# Patient Record
Sex: Female | Born: 1960
Health system: Southern US, Community
[De-identification: ages and names within clinical notes are randomized; demographics above are authoritative.]

## PROBLEM LIST (undated history)

## (undated) DIAGNOSIS — G43829 Menstrual migraine, not intractable, without status migrainosus: Secondary | ICD-10-CM

## (undated) DIAGNOSIS — N390 Urinary tract infection, site not specified: Secondary | ICD-10-CM

## (undated) DIAGNOSIS — Z6741 Type O blood, Rh negative: Secondary | ICD-10-CM

## (undated) DIAGNOSIS — N979 Female infertility, unspecified: Secondary | ICD-10-CM

## (undated) DIAGNOSIS — I10 Essential (primary) hypertension: Secondary | ICD-10-CM

## (undated) DIAGNOSIS — R87619 Unspecified abnormal cytological findings in specimens from cervix uteri: Secondary | ICD-10-CM

## (undated) DIAGNOSIS — E079 Disorder of thyroid, unspecified: Secondary | ICD-10-CM

## (undated) DIAGNOSIS — N841 Polyp of cervix uteri: Secondary | ICD-10-CM

## (undated) DIAGNOSIS — Z87440 Personal history of urinary (tract) infections: Secondary | ICD-10-CM

## (undated) HISTORY — DX: Urinary tract infection, site not specified: N39.0

## (undated) HISTORY — DX: Type O blood, Rh negative: Z67.41

## (undated) HISTORY — DX: Menstrual migraine, not intractable, without status migrainosus: G43.829

## (undated) HISTORY — PX: WISDOM TOOTH EXTRACTION: SHX21

## (undated) HISTORY — DX: Essential (primary) hypertension: I10

## (undated) HISTORY — DX: Unspecified abnormal cytological findings in specimens from cervix uteri: R87.619

## (undated) HISTORY — DX: Personal history of urinary (tract) infections: Z87.440

## (undated) HISTORY — DX: Polyp of cervix uteri: N84.1

## (undated) HISTORY — DX: Disorder of thyroid, unspecified: E07.9

## (undated) HISTORY — PX: COLPOSCOPY: SHX161

## (undated) HISTORY — DX: Female infertility, unspecified: N97.9

---

## 1998-10-25 ENCOUNTER — Other Ambulatory Visit: Admission: RE | Admit: 1998-10-25 | Discharge: 1998-10-25 | Payer: Self-pay | Admitting: Obstetrics and Gynecology

## 2000-11-12 ENCOUNTER — Other Ambulatory Visit: Admission: RE | Admit: 2000-11-12 | Discharge: 2000-11-12 | Payer: Self-pay | Admitting: Obstetrics and Gynecology

## 2001-03-07 ENCOUNTER — Ambulatory Visit (HOSPITAL_COMMUNITY): Admission: RE | Admit: 2001-03-07 | Discharge: 2001-03-07 | Payer: Self-pay | Admitting: Obstetrics and Gynecology

## 2001-03-07 ENCOUNTER — Encounter: Payer: Self-pay | Admitting: Obstetrics and Gynecology

## 2001-03-11 ENCOUNTER — Encounter: Admission: RE | Admit: 2001-03-11 | Discharge: 2001-03-11 | Payer: Self-pay

## 2001-03-11 ENCOUNTER — Encounter: Payer: Self-pay | Admitting: Obstetrics and Gynecology

## 2002-03-03 ENCOUNTER — Other Ambulatory Visit: Admission: RE | Admit: 2002-03-03 | Discharge: 2002-03-03 | Payer: Self-pay | Admitting: Obstetrics and Gynecology

## 2002-08-08 ENCOUNTER — Ambulatory Visit (HOSPITAL_COMMUNITY): Admission: RE | Admit: 2002-08-08 | Discharge: 2002-08-08 | Payer: Self-pay | Admitting: Obstetrics and Gynecology

## 2002-08-08 ENCOUNTER — Encounter: Payer: Self-pay | Admitting: Obstetrics and Gynecology

## 2003-03-05 ENCOUNTER — Other Ambulatory Visit: Admission: RE | Admit: 2003-03-05 | Discharge: 2003-03-05 | Payer: Self-pay | Admitting: Obstetrics and Gynecology

## 2003-08-28 ENCOUNTER — Ambulatory Visit (HOSPITAL_COMMUNITY): Admission: RE | Admit: 2003-08-28 | Discharge: 2003-08-28 | Payer: Self-pay | Admitting: Obstetrics and Gynecology

## 2004-03-27 ENCOUNTER — Other Ambulatory Visit: Admission: RE | Admit: 2004-03-27 | Discharge: 2004-03-27 | Payer: Self-pay | Admitting: Obstetrics and Gynecology

## 2004-09-16 ENCOUNTER — Ambulatory Visit (HOSPITAL_COMMUNITY): Admission: RE | Admit: 2004-09-16 | Discharge: 2004-09-16 | Payer: Self-pay | Admitting: Obstetrics and Gynecology

## 2004-10-01 ENCOUNTER — Encounter: Admission: RE | Admit: 2004-10-01 | Discharge: 2004-10-01 | Payer: Self-pay | Admitting: Obstetrics and Gynecology

## 2005-03-30 ENCOUNTER — Other Ambulatory Visit: Admission: RE | Admit: 2005-03-30 | Discharge: 2005-03-30 | Payer: Self-pay | Admitting: Obstetrics and Gynecology

## 2005-04-01 ENCOUNTER — Encounter: Admission: RE | Admit: 2005-04-01 | Discharge: 2005-04-01 | Payer: Self-pay | Admitting: Obstetrics and Gynecology

## 2005-10-06 ENCOUNTER — Ambulatory Visit (HOSPITAL_COMMUNITY): Admission: RE | Admit: 2005-10-06 | Discharge: 2005-10-06 | Payer: Self-pay | Admitting: Obstetrics and Gynecology

## 2006-03-03 ENCOUNTER — Encounter: Admission: RE | Admit: 2006-03-03 | Discharge: 2006-03-03 | Payer: Self-pay | Admitting: Family Medicine

## 2006-03-15 HISTORY — PX: CHOLECYSTECTOMY, LAPAROSCOPIC: SHX56

## 2006-04-09 ENCOUNTER — Ambulatory Visit (HOSPITAL_COMMUNITY): Admission: RE | Admit: 2006-04-09 | Discharge: 2006-04-09 | Payer: Self-pay | Admitting: Surgery

## 2006-04-09 ENCOUNTER — Encounter (INDEPENDENT_AMBULATORY_CARE_PROVIDER_SITE_OTHER): Payer: Self-pay | Admitting: *Deleted

## 2006-05-27 ENCOUNTER — Other Ambulatory Visit: Admission: RE | Admit: 2006-05-27 | Discharge: 2006-05-27 | Payer: Self-pay | Admitting: Obstetrics and Gynecology

## 2006-06-15 DIAGNOSIS — E079 Disorder of thyroid, unspecified: Secondary | ICD-10-CM

## 2006-06-15 HISTORY — DX: Disorder of thyroid, unspecified: E07.9

## 2006-10-12 ENCOUNTER — Ambulatory Visit (HOSPITAL_COMMUNITY): Admission: RE | Admit: 2006-10-12 | Discharge: 2006-10-12 | Payer: Self-pay | Admitting: Obstetrics and Gynecology

## 2007-05-24 ENCOUNTER — Other Ambulatory Visit: Admission: RE | Admit: 2007-05-24 | Discharge: 2007-05-24 | Payer: Self-pay | Admitting: Obstetrics and Gynecology

## 2007-10-21 ENCOUNTER — Ambulatory Visit (HOSPITAL_COMMUNITY): Admission: RE | Admit: 2007-10-21 | Discharge: 2007-10-21 | Payer: Self-pay | Admitting: Obstetrics and Gynecology

## 2008-06-21 ENCOUNTER — Encounter: Admission: RE | Admit: 2008-06-21 | Discharge: 2008-06-21 | Payer: Self-pay | Admitting: Obstetrics and Gynecology

## 2008-06-21 ENCOUNTER — Other Ambulatory Visit: Admission: RE | Admit: 2008-06-21 | Discharge: 2008-06-21 | Payer: Self-pay | Admitting: Obstetrics and Gynecology

## 2008-11-01 ENCOUNTER — Ambulatory Visit (HOSPITAL_COMMUNITY): Admission: RE | Admit: 2008-11-01 | Discharge: 2008-11-01 | Payer: Self-pay | Admitting: Obstetrics and Gynecology

## 2008-11-09 ENCOUNTER — Encounter: Admission: RE | Admit: 2008-11-09 | Discharge: 2008-11-09 | Payer: Self-pay | Admitting: Endocrinology

## 2008-12-11 ENCOUNTER — Other Ambulatory Visit: Admission: RE | Admit: 2008-12-11 | Discharge: 2008-12-11 | Payer: Self-pay | Admitting: Interventional Radiology

## 2008-12-11 ENCOUNTER — Encounter (INDEPENDENT_AMBULATORY_CARE_PROVIDER_SITE_OTHER): Payer: Self-pay | Admitting: Interventional Radiology

## 2008-12-11 ENCOUNTER — Encounter: Admission: RE | Admit: 2008-12-11 | Discharge: 2008-12-11 | Payer: Self-pay | Admitting: Endocrinology

## 2009-06-15 DIAGNOSIS — I10 Essential (primary) hypertension: Secondary | ICD-10-CM

## 2009-06-15 HISTORY — DX: Essential (primary) hypertension: I10

## 2009-11-12 ENCOUNTER — Ambulatory Visit (HOSPITAL_COMMUNITY): Admission: RE | Admit: 2009-11-12 | Discharge: 2009-11-12 | Payer: Self-pay | Admitting: Obstetrics and Gynecology

## 2009-11-20 ENCOUNTER — Encounter: Admission: RE | Admit: 2009-11-20 | Discharge: 2009-11-20 | Payer: Self-pay | Admitting: Endocrinology

## 2010-07-06 ENCOUNTER — Encounter: Payer: Self-pay | Admitting: Obstetrics and Gynecology

## 2010-07-06 ENCOUNTER — Encounter: Payer: Self-pay | Admitting: Family Medicine

## 2010-09-24 ENCOUNTER — Other Ambulatory Visit: Payer: Self-pay | Admitting: Obstetrics and Gynecology

## 2010-09-24 DIAGNOSIS — Z1231 Encounter for screening mammogram for malignant neoplasm of breast: Secondary | ICD-10-CM

## 2010-10-31 NOTE — Op Note (Signed)
NAMECRYSTALLEE, WERDEN               ACCOUNT NO.:  [id number]   MEDICAL RECORD NO.:  [id number]          PATIENT TYPE:  AMB   LOCATION:  DAY                          FACILITY:  [facility]   PHYSICIAN:  Wilmon Arms. Corliss Skains, M.D. DATE OF BIRTH:  [birth year]   DATE OF PROCEDURE:  04/09/2006  DATE OF DISCHARGE:  04/09/2006                                 OPERATIVE REPORT   PREOPERATIVE DIAGNOSIS:  Chronic calculus cholecystitis.   POSTOPERATIVE DIAGNOSIS:  Chronic calculus cholecystitis.   PROCEDURE PERFORMED:  Laparoscopic cholecystectomy with intraoperative  cholangiogram.   SURGEON:  Wilmon Arms. Tsuei, M.D.   ANESTHESIA:  General endotracheal.   INDICATIONS:  The patient is a 50 year old female who recently presented  with several months of intermittent right upper quadrant pain radiating  through to her back.  This usually happens with certain types of food.  The  episodes became more frequent.  An ultrasound showed multiple gallstones.  The common duct was normal.  She was evaluated and we recommended  laparoscopic cholecystectomy.   DESCRIPTION OF PROCEDURE:  The patient brought to the operating room and  placed in the supine position on the operating table.  After an adequate  level of general anesthesia was obtained, the patient's abdomen was prepped  with Betadine and draped in sterile fashion.  Her umbilicus was infiltrated  with 0.25% Marcaine.  A transverse incision was made below the umbilicus.  Dissection was carried down to the fascia which was opened vertically.  The  peritoneal cavity was bluntly entered.  A stay suture of 0 Vicryl was placed  around the fascial opening.  The Hasson cannula was inserted and secured  with the stay suture.  Pneumoperitoneum was obtained by insufflating CO2,  maintaining a maximal pressure of 50 mmHg.  The patient was positioned in  reverse Trendelenburg position, tilted slightly to her left.  A 10 mm port  was placed in the subxiphoid position,  two 5 mm ports in the right upper  quadrant.  The fundus of the gallbladder was grasped and clamped, and  elevated over the edge of the liver.  The peritoneum around the hilum of the  gallbladder was opened with cautery.  We were able to identify the cystic  duct.  We circumferentially dissected around the duct.  The duct was ligated  distally with a clip.  The small opening was created on the cystic duct with  the scissors.  A Cook cholangiogram catheter was then inserted through a  stab incision and threaded into the cystic duct.  It was held with a clip.  A cholangiogram was then obtained which showed good flow proximally and  distally and a small caliber common bile duct.  There was good flow into  both sides of the liver as well as into the duodenum.  No filling defects  were noted.  The catheter was then removed.  The duct was ligated clips  distally and divided.  The cystic artery was also ligated with clips and  divided.  Cautery was then used to remove the gallbladder from the liver  bed.  Hemostasis was  obtained with cautery.  The gallbladder was placed in  an EndoCatch sac and removed through the umbilical fascia.  The pursestring  suture was used to close down the umbilical fascia.  We reinspected the  right upper quadrant.  No bleeding was noted.  The irrigant was  suctioned out.  Pneumoperitoneum was then released as the ports were  removed.  Monocryl 4-0 was used to close all skin incisions.  Steri-Strips  and clean dressings were applied.  The patient was then extubated and  brought to recovery in stable condition.  All sponge, instrument, and needle  counts were correct.      Wilmon Arms. Tsuei, M.D.  Electronically Signed     MKT/MEDQ  D:  04/09/2006  T:  04/11/2006  Job:  [id number]   cc:   Quita Skye. Artis Flock, M.D.  Fax: [phone]

## 2010-11-14 ENCOUNTER — Ambulatory Visit (HOSPITAL_COMMUNITY)
Admission: RE | Admit: 2010-11-14 | Discharge: 2010-11-14 | Disposition: A | Discharge status: Home / Self Care | Payer: Managed Care, Other (non HMO) | Source: Ambulatory Visit | Attending: Obstetrics and Gynecology | Admitting: Obstetrics and Gynecology

## 2010-11-14 DIAGNOSIS — Z1231 Encounter for screening mammogram for malignant neoplasm of breast: Secondary | ICD-10-CM | POA: Insufficient documentation

## 2010-11-20 ENCOUNTER — Other Ambulatory Visit: Payer: Self-pay | Admitting: Obstetrics and Gynecology

## 2010-11-20 DIAGNOSIS — R928 Other abnormal and inconclusive findings on diagnostic imaging of breast: Secondary | ICD-10-CM

## 2010-11-24 ENCOUNTER — Ambulatory Visit
Admission: RE | Admit: 2010-11-24 | Discharge: 2010-11-24 | Disposition: A | Discharge status: Home / Self Care | Payer: Managed Care, Other (non HMO) | Source: Ambulatory Visit | Attending: Obstetrics and Gynecology | Admitting: Obstetrics and Gynecology

## 2010-11-24 DIAGNOSIS — R928 Other abnormal and inconclusive findings on diagnostic imaging of breast: Secondary | ICD-10-CM

## 2011-04-24 ENCOUNTER — Other Ambulatory Visit: Payer: Self-pay | Admitting: Obstetrics and Gynecology

## 2011-04-24 DIAGNOSIS — N6459 Other signs and symptoms in breast: Secondary | ICD-10-CM

## 2011-05-20 ENCOUNTER — Ambulatory Visit
Admission: RE | Admit: 2011-05-20 | Discharge: 2011-05-20 | Disposition: A | Discharge status: Home / Self Care | Payer: Managed Care, Other (non HMO) | Source: Ambulatory Visit | Attending: Obstetrics and Gynecology | Admitting: Obstetrics and Gynecology

## 2011-05-20 DIAGNOSIS — N6459 Other signs and symptoms in breast: Secondary | ICD-10-CM

## 2011-06-22 ENCOUNTER — Other Ambulatory Visit: Payer: Self-pay | Admitting: Endocrinology

## 2011-06-22 DIAGNOSIS — E049 Nontoxic goiter, unspecified: Secondary | ICD-10-CM

## 2011-08-13 LAB — HM PAP SMEAR: HM Pap smear: NEGATIVE

## 2011-08-17 ENCOUNTER — Ambulatory Visit
Admission: RE | Admit: 2011-08-17 | Discharge: 2011-08-17 | Disposition: A | Discharge status: Home / Self Care | Payer: Managed Care, Other (non HMO) | Source: Ambulatory Visit | Attending: Endocrinology | Admitting: Endocrinology

## 2011-08-17 DIAGNOSIS — E049 Nontoxic goiter, unspecified: Secondary | ICD-10-CM

## 2011-10-14 ENCOUNTER — Other Ambulatory Visit: Payer: Self-pay | Admitting: Obstetrics and Gynecology

## 2011-10-14 DIAGNOSIS — N6489 Other specified disorders of breast: Secondary | ICD-10-CM

## 2011-11-20 ENCOUNTER — Other Ambulatory Visit: Payer: Managed Care, Other (non HMO)

## 2011-12-03 ENCOUNTER — Ambulatory Visit
Admission: RE | Admit: 2011-12-03 | Discharge: 2011-12-03 | Disposition: A | Discharge status: Home / Self Care | Payer: Managed Care, Other (non HMO) | Source: Ambulatory Visit | Attending: Obstetrics and Gynecology | Admitting: Obstetrics and Gynecology

## 2011-12-03 ENCOUNTER — Other Ambulatory Visit: Payer: Self-pay | Admitting: Obstetrics and Gynecology

## 2011-12-03 DIAGNOSIS — N6489 Other specified disorders of breast: Secondary | ICD-10-CM

## 2012-06-20 ENCOUNTER — Other Ambulatory Visit: Payer: Self-pay | Admitting: Endocrinology

## 2012-06-20 DIAGNOSIS — E049 Nontoxic goiter, unspecified: Secondary | ICD-10-CM

## 2012-06-28 ENCOUNTER — Ambulatory Visit
Admission: RE | Admit: 2012-06-28 | Discharge: 2012-06-28 | Disposition: A | Discharge status: Home / Self Care | Payer: BC Managed Care – PPO | Source: Ambulatory Visit | Attending: Endocrinology | Admitting: Endocrinology

## 2012-06-28 DIAGNOSIS — E049 Nontoxic goiter, unspecified: Secondary | ICD-10-CM

## 2012-10-25 ENCOUNTER — Other Ambulatory Visit: Payer: Self-pay | Admitting: Obstetrics and Gynecology

## 2012-10-25 DIAGNOSIS — Z1231 Encounter for screening mammogram for malignant neoplasm of breast: Secondary | ICD-10-CM

## 2012-12-06 ENCOUNTER — Ambulatory Visit (HOSPITAL_COMMUNITY)
Admission: RE | Admit: 2012-12-06 | Discharge: 2012-12-06 | Disposition: A | Discharge status: Home / Self Care | Payer: BC Managed Care – PPO | Source: Ambulatory Visit | Attending: Obstetrics and Gynecology | Admitting: Obstetrics and Gynecology

## 2012-12-06 DIAGNOSIS — Z1231 Encounter for screening mammogram for malignant neoplasm of breast: Secondary | ICD-10-CM | POA: Insufficient documentation

## 2012-12-19 ENCOUNTER — Other Ambulatory Visit: Payer: Self-pay | Admitting: Endocrinology

## 2012-12-19 DIAGNOSIS — E049 Nontoxic goiter, unspecified: Secondary | ICD-10-CM

## 2013-04-10 ENCOUNTER — Encounter: Payer: Self-pay | Admitting: Obstetrics & Gynecology

## 2013-04-10 ENCOUNTER — Encounter: Payer: Self-pay | Admitting: Nurse Practitioner

## 2013-04-10 ENCOUNTER — Ambulatory Visit (INDEPENDENT_AMBULATORY_CARE_PROVIDER_SITE_OTHER): Payer: BC Managed Care – PPO | Admitting: Nurse Practitioner

## 2013-04-10 VITALS — BP 118/72 | HR 68 | Temp 97.8°F | Resp 16 | Ht 64.25 in | Wt 195.0 lb

## 2013-04-10 DIAGNOSIS — N390 Urinary tract infection, site not specified: Secondary | ICD-10-CM

## 2013-04-10 LAB — POCT URINALYSIS DIPSTICK
Bilirubin, UA: NEGATIVE
Glucose, UA: NEGATIVE
Ketones, UA: NEGATIVE
Nitrite, UA: POSITIVE
Protein, UA: NEGATIVE
Urobilinogen, UA: NEGATIVE
pH, UA: 5

## 2013-04-10 MED ORDER — CIPROFLOXACIN HCL 500 MG PO TABS: 500.0000 mg | ORAL_TABLET | Freq: Two times a day (BID) | ORAL | Status: DC

## 2013-04-10 NOTE — Patient Instructions (Signed)
We will call with results and follow if TOC is indicated.

## 2013-04-10 NOTE — Progress Notes (Signed)
S:  52 y.o. G0 Married Caucasian female presents with UTI symptoms that started on  October 24, with symptoms of dysuria, urinary urgency.  Pertinent negatives include no N/ V, flank pain. The patient is having no constitutional symptoms, denying fever, chills, anorexia, or weight loss.  Symptoms are related to post coital and normally takes no antibiotics PC.  She also denies vaginal dryness.   Current method of birth control OCP. Same partner without change. Last UTI documented in  2010. Denies any vaginal symptoms or discharge.  ROS: no weight loss, fever, night sweats and feels well  O alert, oriented to person, place, and time, normal mood, behavior,  speech, dress, motor activity, and thought processes   obese,  not in acute distress, well developed and well nourished  Abdomen mild suprapubic discomfort, without guarding  No CVA tenderness    Diagnostic Test:    Urine culture   urine chemstrip  Poct urine- wbc 1+, nitrite positive, rbc 2+  Assessment:  R/O UTI   History of post coital UTI - but years since last infection     Plan:  Medications: ciprofloxacin. Maintain adequate hydration. Follow up if symptoms not improving, and as needed.   Medication Therapy: 500 mg two times daily   Lab:TOC if Urine Culture is positive

## 2013-04-12 LAB — URINE CULTURE: Colony Count: 50000

## 2013-04-12 NOTE — Progress Notes (Signed)
Encounter reviewed by Dr. Brook Silva.  

## 2013-04-13 ENCOUNTER — Other Ambulatory Visit: Payer: Self-pay | Admitting: Nurse Practitioner

## 2013-04-13 DIAGNOSIS — N390 Urinary tract infection, site not specified: Secondary | ICD-10-CM

## 2013-04-17 ENCOUNTER — Telehealth: Payer: Self-pay | Admitting: *Deleted

## 2013-04-17 NOTE — Telephone Encounter (Signed)
Message copied by PHILLIPS, STEPHANIE C on Mon Apr 17, 2013  9:54 AM ------      Message from: GRUBB, PATRICIA R      Created: Thu Apr 13, 2013  8:40 AM       Let patient know her urine C & S was post ive and she is on the correct antibiotic.  She will need TOC in 2 weeks for a nurse visit.  Order for repeat culture is placed in Epic. ------ 

## 2013-04-17 NOTE — Telephone Encounter (Signed)
I have attempted to contact this patient by phone with the following results: left message to return my call on answering machine (mobile/home). 

## 2013-04-17 NOTE — Telephone Encounter (Signed)
The patient has been notified of this information and all questions answered.

## 2013-04-17 NOTE — Telephone Encounter (Signed)
Message copied by Luisa Dago on Mon Apr 17, 2013  9:54 AM ------      Message from: Ria Comment R      Created: Thu Apr 13, 2013  8:40 AM       Let patient know her urine C & S was post ive and she is on the correct antibiotic.  She will need TOC in 2 weeks for a nurse visit.  Order for repeat culture is placed in Epic. ------

## 2013-04-17 NOTE — Telephone Encounter (Signed)
Patient is returning Stephanie's call

## 2013-04-25 ENCOUNTER — Ambulatory Visit (INDEPENDENT_AMBULATORY_CARE_PROVIDER_SITE_OTHER): Payer: BC Managed Care – PPO

## 2013-04-25 VITALS — BP 136/82 | HR 60 | Resp 16 | Ht 64.25 in | Wt 193.2 lb

## 2013-04-25 DIAGNOSIS — N390 Urinary tract infection, site not specified: Secondary | ICD-10-CM

## 2013-04-25 NOTE — Progress Notes (Signed)
Patient here for urine toc. States is feeling better. She completed her medication.

## 2013-04-26 LAB — URINE CULTURE: Organism ID, Bacteria: NO GROWTH

## 2013-06-19 ENCOUNTER — Ambulatory Visit
Admission: RE | Admit: 2013-06-19 | Discharge: 2013-06-19 | Disposition: A | Discharge status: Home / Self Care | Payer: BC Managed Care – PPO | Source: Ambulatory Visit | Attending: Endocrinology | Admitting: Endocrinology

## 2013-06-19 DIAGNOSIS — E049 Nontoxic goiter, unspecified: Secondary | ICD-10-CM

## 2013-06-27 ENCOUNTER — Other Ambulatory Visit: Payer: Self-pay | Admitting: Endocrinology

## 2013-06-27 DIAGNOSIS — E049 Nontoxic goiter, unspecified: Secondary | ICD-10-CM

## 2013-08-23 ENCOUNTER — Ambulatory Visit (INDEPENDENT_AMBULATORY_CARE_PROVIDER_SITE_OTHER): Payer: BC Managed Care – PPO | Admitting: Nurse Practitioner

## 2013-08-23 ENCOUNTER — Encounter: Payer: Self-pay | Admitting: Nurse Practitioner

## 2013-08-23 VITALS — BP 130/72 | HR 60 | Ht 64.0 in | Wt 191.0 lb

## 2013-08-23 DIAGNOSIS — I10 Essential (primary) hypertension: Secondary | ICD-10-CM | POA: Insufficient documentation

## 2013-08-23 DIAGNOSIS — Z01419 Encounter for gynecological examination (general) (routine) without abnormal findings: Secondary | ICD-10-CM

## 2013-08-23 DIAGNOSIS — R8290 Unspecified abnormal findings in urine: Secondary | ICD-10-CM

## 2013-08-23 DIAGNOSIS — IMO0002 Reserved for concepts with insufficient information to code with codable children: Secondary | ICD-10-CM | POA: Insufficient documentation

## 2013-08-23 DIAGNOSIS — Z0000 Encounter for general adult medical examination without abnormal findings: Secondary | ICD-10-CM

## 2013-08-23 DIAGNOSIS — E039 Hypothyroidism, unspecified: Secondary | ICD-10-CM | POA: Insufficient documentation

## 2013-08-23 DIAGNOSIS — N979 Female infertility, unspecified: Secondary | ICD-10-CM

## 2013-08-23 DIAGNOSIS — R82998 Other abnormal findings in urine: Secondary | ICD-10-CM

## 2013-08-23 LAB — POCT URINALYSIS DIPSTICK
Bilirubin, UA: NEGATIVE
Glucose, UA: NEGATIVE
Ketones, UA: NEGATIVE
Nitrite, UA: NEGATIVE
Urobilinogen, UA: NEGATIVE
pH, UA: 5.5

## 2013-08-23 LAB — HEMOGLOBIN, FINGERSTICK: Hemoglobin, fingerstick: 13.3 g/dL (ref 12.0–16.0)

## 2013-08-23 MED ORDER — FLUCONAZOLE 150 MG PO TABS: 150.0000 mg | ORAL_TABLET | Freq: Once | ORAL | Status: DC

## 2013-08-23 MED ORDER — SULFAMETHOXAZOLE-TMP DS 800-160 MG PO TABS: ORAL_TABLET | ORAL | Status: DC

## 2013-08-23 NOTE — Patient Instructions (Signed)

## 2013-08-23 NOTE — Progress Notes (Signed)
Patient ID: Melanie Vaughn, female   DOB: Feb 18, 1961, 53 y.o.   MRN: [id number] 52 y.o. G0P0 Married Caucasian Fe here for annual exam.  Last year 08/2012 normal cycle and ended OCP. March 2014 had an increase in vaso symptoms in April and May. None since June.  Then 6/28 with long cycles and spotting for 11 days. Then 8/10 had another cycle of flow and spotting for 9 days. Sept 18 very heavy flow X 5 then spotting x 3. 10/1 normal flow and spotting X 10 days. 10/28 flow for 5 days normal. Jan 21 spotting and normal X 5 days.  No urinary symptoms.  Some vaginal itching for several weeks after change in laundry detergents.  Most of symptoms are external.  Patient's last menstrual period was 07/05/2013.          Sexually active: yes  The current method of family planning is none.    Exercising: yes  Walking, kettle ball, hiking Smoker:  no  Health Maintenance: Pap:08/13/11, WNL neg HR HPV MMG: 12/06/12, Bi-Rads 1: negative Colonoscopy:  Never   TDaP:  02/2002 Labs: HB:  13.3 Urine:  Trace RBC, 2+ leuk's, trace protein, pH 5.5   reports that she has never smoked. She does not have any smokeless tobacco history on file. She reports that she does not drink alcohol or use illicit drugs.  Past Medical History  Diagnosis Date  . Infertility, female   . Menstrual migraine   . Recurrent UTI     Past Surgical History  Procedure Laterality Date  . Cholecystectomy, laparoscopic  10/07    Current Outpatient Prescriptions  Medication Sig Dispense Refill  . Calcium Carbonate-Vitamin D (CALCIUM-VITAMIN D) 500-200 MG-UNIT per tablet Take 1 tablet by mouth 2 (two) times daily with a meal.      . Cholecalciferol (VITAMIN D PO) Take 1,000 Int'l Units by mouth daily.       Marland Kitchen CRANBERRY PO Take by mouth daily.       Marland Kitchen FIBER PO Take by mouth daily.       Marland Kitchen levothyroxine (SYNTHROID, LEVOTHROID) 88 MCG tablet Take 88 mcg by mouth daily before breakfast.      . losartan (COZAAR) 50 MG tablet Take 1 tablet by  mouth daily.      . Multiple Vitamin (MULTIVITAMIN) tablet Take 1 tablet by mouth daily.      . Probiotic Product (PROBIOTIC PO) Take by mouth daily.        No current facility-administered medications for this visit.    Family History  Problem Relation Age of Onset  . Stroke Mother     mini stroke  . Heart disease Mother   . Diabetes Father   . Heart disease Father     CHF  . Heart disease Maternal Grandfather     ROS:  Pertinent items are noted in HPI.  Otherwise, a comprehensive ROS was negative.  Exam:   BP 130/72  Pulse 60  Ht 5\' 4"  (1.626 m)  Wt 191 lb (86.637 kg)  BMI 32.77 kg/m2  LMP 07/05/2013 Height: 5\' 4"  (162.6 cm)  Ht Readings from Last 3 Encounters:  08/23/13 5\' 4"  (1.626 m)  04/25/13 5' 4.25" (1.632 m)  04/10/13 5' 4.25" (1.632 m)    General appearance: alert, cooperative and appears stated age Head: Normocephalic, without obvious abnormality, atraumatic Neck: no adenopathy, supple, symmetrical, trachea midline and thyroid normal to inspection and palpation Lungs: clear to auscultation bilaterally Breasts: normal appearance, no masses or tenderness  Heart: regular rate and rhythm Abdomen: soft, non-tender; no masses,  no organomegaly Extremities: extremities normal, atraumatic, no cyanosis or edema Skin: Skin color, texture, turgor normal. No rashes or lesions Lymph nodes: Cervical, supraclavicular, and axillary nodes normal. No abnormal inguinal nodes palpated Neurologic: Grossly normal   Pelvic: External genitalia:  no lesions but irritation that may be yeast or allergic reaction.              Urethra:  normal appearing urethra with no masses, tenderness or lesions              Bartholin's and Skene's: normal                 Vagina: normal appearing vagina with normal color and discharge, no lesions              Cervix: anteverted              Pap taken: no Bimanual Exam:  Uterus:  normal size, contour, position, consistency, mobility, non-tender               Adnexa: no mass, fullness, tenderness               Rectovaginal: Confirms               Anus:  normal sphincter tone, no lesions  A:  Well Woman with normal exam  Perimenopausal still irregular cycles off OCP  External yeast vaginitis vs allergic reaction  PC UTI uses Septra prn  History of hypothyroid, infertility, HTN, borderline DM     P:   Pap smear as per guidelines   Mammogram due 11/2013  RX for Diflucan 150 mg X 2 if no better to call back  Refill on Septra DS to use PC prn  Dr. Chalmers Cater follows endocrine problems  Counseled on breast self exam, mammography screening, adequate intake of calcium and vitamin D, diet and exercise return annually or prn  An After Visit Summary was printed and given to the patient.

## 2013-08-25 LAB — URINE CULTURE: Colony Count: >=100000

## 2013-08-29 ENCOUNTER — Telehealth: Payer: Self-pay | Admitting: Nurse Practitioner

## 2013-08-29 NOTE — Telephone Encounter (Signed)
Patient was treated last week for a yeast infection and still has symptoms.

## 2013-08-29 NOTE — Telephone Encounter (Signed)
Spoke with patient she took [place] on 3/11 and 3/13. Patient c/o "Lots of itching that is worse at night." No vaginal discharge. Patient reports itching in vulvar and buttock area stating that it is about the same since [facility] last saw it. Used generic Monistat with no relief. Patient has d/c use of old detergent. Reports keeping the area clean and dry.   Please advise for next steps.

## 2013-08-29 NOTE — Progress Notes (Signed)
Encounter reviewed by Dr. Brook Silva.  

## 2013-08-29 NOTE — Telephone Encounter (Signed)
Call in Triamcinolone with nystatin to use BID prn and things should calm down. She may also do Aveeno sitz bath.

## 2013-08-30 MED ORDER — NYSTATIN-TRIAMCINOLONE 100000-0.1 UNIT/GM-% EX OINT: 1.0000 "application " | TOPICAL_OINTMENT | Freq: Two times a day (BID) | CUTANEOUS | Status: DC

## 2013-08-30 NOTE — Telephone Encounter (Signed)
Spoke with patient. Patient is aware of prescription being sent to pharmacy of choice. Patient states that she is still experiencing "lots of itching at night." No vaginal discharge. Patient declines appointment today stating that she will try the Triamcinolone with nystatin first to see if it provides her any relief. Patient states that if it does not get any better she will call to schedule an appointment to be seen. Advised if she experiences any symptom changes or symptoms worsen to call to get an appointment. Patient also instructed she can use Aveeno sitz bath to provide relief.  Routing to provider for final review. Patient agreeable to disposition. Will close encounter

## 2013-11-29 ENCOUNTER — Other Ambulatory Visit: Payer: Self-pay | Admitting: Nurse Practitioner

## 2013-11-29 DIAGNOSIS — Z1231 Encounter for screening mammogram for malignant neoplasm of breast: Secondary | ICD-10-CM

## 2013-12-07 ENCOUNTER — Ambulatory Visit (HOSPITAL_COMMUNITY)
Admission: RE | Admit: 2013-12-07 | Discharge: 2013-12-07 | Disposition: A | Discharge status: Home / Self Care | Payer: BC Managed Care – PPO | Source: Ambulatory Visit | Attending: Nurse Practitioner | Admitting: Nurse Practitioner

## 2013-12-07 DIAGNOSIS — Z1231 Encounter for screening mammogram for malignant neoplasm of breast: Secondary | ICD-10-CM | POA: Insufficient documentation

## 2013-12-25 ENCOUNTER — Ambulatory Visit
Admission: RE | Admit: 2013-12-25 | Discharge: 2013-12-25 | Disposition: A | Discharge status: Home / Self Care | Payer: BC Managed Care – PPO | Source: Ambulatory Visit | Attending: Endocrinology | Admitting: Endocrinology

## 2013-12-25 DIAGNOSIS — E049 Nontoxic goiter, unspecified: Secondary | ICD-10-CM

## 2013-12-29 ENCOUNTER — Other Ambulatory Visit: Payer: Self-pay | Admitting: Endocrinology

## 2013-12-29 DIAGNOSIS — E049 Nontoxic goiter, unspecified: Secondary | ICD-10-CM

## 2014-08-30 ENCOUNTER — Ambulatory Visit: Payer: BC Managed Care – PPO | Admitting: Nurse Practitioner

## 2014-09-27 ENCOUNTER — Ambulatory Visit: Payer: Self-pay | Admitting: Nurse Practitioner

## 2014-10-02 ENCOUNTER — Telehealth: Payer: Self-pay | Admitting: Emergency Medicine

## 2014-10-02 ENCOUNTER — Ambulatory Visit (INDEPENDENT_AMBULATORY_CARE_PROVIDER_SITE_OTHER): Payer: BLUE CROSS/BLUE SHIELD | Admitting: Nurse Practitioner

## 2014-10-02 ENCOUNTER — Encounter: Payer: Self-pay | Admitting: Nurse Practitioner

## 2014-10-02 VITALS — BP 118/66 | HR 72 | Ht 64.25 in | Wt 202.0 lb

## 2014-10-02 DIAGNOSIS — Z0000 Encounter for general adult medical examination without abnormal findings: Secondary | ICD-10-CM

## 2014-10-02 DIAGNOSIS — Z23 Encounter for immunization: Secondary | ICD-10-CM | POA: Diagnosis not present

## 2014-10-02 DIAGNOSIS — Z01419 Encounter for gynecological examination (general) (routine) without abnormal findings: Secondary | ICD-10-CM | POA: Diagnosis not present

## 2014-10-02 DIAGNOSIS — N912 Amenorrhea, unspecified: Secondary | ICD-10-CM | POA: Diagnosis not present

## 2014-10-02 LAB — POCT URINALYSIS DIPSTICK
Bilirubin, UA: NEGATIVE
Blood, UA: NEGATIVE
Glucose, UA: NEGATIVE
Ketones, UA: NEGATIVE
Leukocytes, UA: NEGATIVE
Nitrite, UA: NEGATIVE
Protein, UA: NEGATIVE
Urobilinogen, UA: NEGATIVE
pH, UA: 5.5

## 2014-10-02 MED ORDER — SULFAMETHOXAZOLE-TRIMETHOPRIM 800-160 MG PO TABS: ORAL_TABLET | ORAL | Status: DC

## 2014-10-02 MED ORDER — SULFAMETHOXAZOLE-TRIMETHOPRIM 800-160 MG PO TABS: 1.0000 | ORAL_TABLET | Freq: Two times a day (BID) | ORAL | Status: DC

## 2014-10-02 MED ORDER — MEDROXYPROGESTERONE ACETATE 10 MG PO TABS: 10.0000 mg | ORAL_TABLET | Freq: Every day | ORAL | Status: DC

## 2014-10-02 NOTE — Telephone Encounter (Signed)
Received incoming call from Pharmacist at [facility] to clarify orders for Bactrim DS.  Advised order: Bactrim DS take one tablet PO post coidal as needed.  Order accepted by pharmacist.   Routing to provider for final review. Patient agreeable to disposition. Will close encounter

## 2014-10-02 NOTE — Patient Instructions (Addendum)

## 2014-10-02 NOTE — Progress Notes (Signed)
Patient ID: Melanie Vaughn, female   DOB: [birth year], 54 y.o.   MRN: [id number] 53 y.o. G0P0 Married  Caucasian Fe here for annual exam.  No menses since 10/06/2013 X 10 days.  No vaginal spotting or bleeding since. No Vaso symptoms or PMS.  No increase in vaginal dryness.  Sister  is going through a divorce and will be moving to this area.  Patient's last menstrual period was 10/06/2013 (exact date).          Sexually active: Yes.    The current method of family planning is post menopausal status.    Exercising: Yes.    walking, weights, and stretching Smoker:  no  Health Maintenance: Pap:  08/13/11, negative with neg HR HPV MMG:  12/07/13, Bi-Rads 1:  Negative Colonoscopy:  Never will plan later this year with PCP - Declines IFOB TD:  02/2002 Labs:  HB:  PCP  Urine:  Negative    reports that she has never smoked. She does not have any smokeless tobacco history on file. She reports that she does not drink alcohol or use illicit drugs.  Past Medical History  Diagnosis Date  . Infertility, female   . Menstrual migraine   . Recurrent UTI   . Thyroid disease 2008  . Hypertension 2011 ?    Past Surgical History  Procedure Laterality Date  . Cholecystectomy, laparoscopic  10/07  . Wisdom tooth extraction  age 53    Current Outpatient Prescriptions  Medication Sig Dispense Refill  . Calcium Carbonate-Vitamin D (CALCIUM-VITAMIN D) 500-200 MG-UNIT per tablet Take 1 tablet by mouth 2 (two) times daily with a meal.    . celecoxib (CELEBREX) 200 MG capsule Take 1 capsule by mouth 2 (two) times daily.  2  . Cholecalciferol (VITAMIN D PO) Take 1,000 Int'l Units by mouth daily.     Marland Kitchen CRANBERRY PO Take by mouth daily.     Marland Kitchen FIBER PO Take by mouth daily.     Marland Kitchen levothyroxine (SYNTHROID, LEVOTHROID) 88 MCG tablet Take 88 mcg by mouth daily before breakfast.    . losartan (COZAAR) 50 MG tablet Take 1 tablet by mouth daily.    . Multiple Vitamin (MULTIVITAMIN) tablet Take 1 tablet by mouth daily.     . Probiotic Product (PROBIOTIC PO) Take by mouth daily.     . medroxyPROGESTERone (PROVERA) 10 MG tablet Take 1 tablet (10 mg total) by mouth daily. 10 tablet 0  . sulfamethoxazole-trimethoprim (BACTRIM DS,SEPTRA DS) 800-160 MG per tablet Take PC prn 30 tablet 12   No current facility-administered medications for this visit.    Family History  Problem Relation Age of Onset  . Stroke Mother     mini stroke  . Heart disease Mother   . Diabetes Father   . Heart disease Father     CHF  . Kidney failure Father   . Heart disease Maternal Grandfather   . COPD Maternal Grandmother     ROS:  Pertinent items are noted in HPI.  Otherwise, a comprehensive ROS was negative.  Exam:   BP 118/66 mmHg  Pulse 72  Ht 5' 4.25" (1.632 m)  Wt 202 lb (91.627 kg)  BMI 34.40 kg/m2  LMP 10/06/2013 (Exact Date) Height: 5' 4.25" (163.2 cm) Ht Readings from Last 3 Encounters:  10/02/14 5' 4.25" (1.632 m)  08/23/13 5\' 4"  (1.626 m)  04/25/13 5' 4.25" (1.632 m)    General appearance: alert, cooperative and appears stated age Head: Normocephalic, without obvious abnormality, atraumatic  Neck: no adenopathy, supple, symmetrical, trachea midline and thyroid normal to inspection and palpation Lungs: clear to auscultation bilaterally Breasts: normal appearance, no masses or tenderness Heart: regular rate and rhythm Abdomen: soft, non-tender; no masses,  no organomegaly Extremities: extremities normal, atraumatic, no cyanosis or edema Skin: Skin color, texture, turgor normal. No rashes or lesions Lymph nodes: Cervical, supraclavicular, and axillary nodes normal. No abnormal inguinal nodes palpated Neurologic: Grossly normal   Pelvic: External genitalia:  no lesions              Urethra:  normal appearing urethra with no masses, tenderness or lesions              Bartholin's and Skene's: normal                 Vagina: normal appearing vagina with normal color and discharge, no lesions               Cervix: anteverted              Pap taken: Yes.   Bimanual Exam:  Uterus:  normal size, contour, position, consistency, mobility, non-tender              Adnexa: no mass, fullness, tenderness               Rectovaginal: Confirms               Anus:  normal sphincter tone, no lesions  Chaperone present:  yes  A:  Well Woman with normal exam  Perimenopausal with hx. irregular cycles off OCP, and current amenorrhea PC UTI uses Septra prn History of hypothyroid, infertility, HTN, borderline DM  Update immunization    P:   Reviewed health and wellness pertinent to exam  Pap smear taken today  Mammogram is due 11/2014  Provera challenge 10 mg X 10 days and to call with response  TDaP given today  Counseled on breast self exam, mammography screening, adequate intake of calcium and vitamin D, diet and exercise, Kegel's exercises return annually or prn  An After Visit Summary was printed and given to the patient.

## 2014-10-04 LAB — IPS PAP TEST WITH HPV

## 2014-10-04 NOTE — Progress Notes (Signed)
Encounter reviewed by Dr. Brook Silva.  

## 2014-11-19 ENCOUNTER — Other Ambulatory Visit: Payer: Self-pay | Admitting: Nurse Practitioner

## 2014-11-19 DIAGNOSIS — Z1231 Encounter for screening mammogram for malignant neoplasm of breast: Secondary | ICD-10-CM

## 2014-12-11 ENCOUNTER — Ambulatory Visit (HOSPITAL_COMMUNITY)
Admission: RE | Admit: 2014-12-11 | Discharge: 2014-12-11 | Disposition: A | Discharge status: Home / Self Care | Payer: BLUE CROSS/BLUE SHIELD | Source: Ambulatory Visit | Attending: Nurse Practitioner | Admitting: Nurse Practitioner

## 2014-12-11 DIAGNOSIS — Z1231 Encounter for screening mammogram for malignant neoplasm of breast: Secondary | ICD-10-CM | POA: Diagnosis present

## 2014-12-20 ENCOUNTER — Ambulatory Visit
Admission: RE | Admit: 2014-12-20 | Discharge: 2014-12-20 | Disposition: A | Discharge status: Home / Self Care | Payer: BLUE CROSS/BLUE SHIELD | Source: Ambulatory Visit | Attending: Endocrinology | Admitting: Endocrinology

## 2014-12-20 DIAGNOSIS — E049 Nontoxic goiter, unspecified: Secondary | ICD-10-CM

## 2015-04-08 ENCOUNTER — Other Ambulatory Visit: Payer: Self-pay | Admitting: Endocrinology

## 2015-04-08 DIAGNOSIS — E049 Nontoxic goiter, unspecified: Secondary | ICD-10-CM

## 2015-06-18 DIAGNOSIS — R7303 Prediabetes: Secondary | ICD-10-CM | POA: Insufficient documentation

## 2015-10-08 ENCOUNTER — Encounter: Payer: Self-pay | Admitting: Nurse Practitioner

## 2015-10-08 ENCOUNTER — Ambulatory Visit (INDEPENDENT_AMBULATORY_CARE_PROVIDER_SITE_OTHER): Payer: BLUE CROSS/BLUE SHIELD | Admitting: Nurse Practitioner

## 2015-10-08 VITALS — BP 120/76 | HR 82 | Resp 16 | Ht 64.0 in | Wt 208.0 lb

## 2015-10-08 DIAGNOSIS — Z0000 Encounter for general adult medical examination without abnormal findings: Secondary | ICD-10-CM

## 2015-10-08 DIAGNOSIS — Z1211 Encounter for screening for malignant neoplasm of colon: Secondary | ICD-10-CM | POA: Diagnosis not present

## 2015-10-08 DIAGNOSIS — Z01419 Encounter for gynecological examination (general) (routine) without abnormal findings: Secondary | ICD-10-CM

## 2015-10-08 DIAGNOSIS — N912 Amenorrhea, unspecified: Secondary | ICD-10-CM

## 2015-10-08 DIAGNOSIS — E559 Vitamin D deficiency, unspecified: Secondary | ICD-10-CM

## 2015-10-08 DIAGNOSIS — R8290 Unspecified abnormal findings in urine: Secondary | ICD-10-CM | POA: Diagnosis not present

## 2015-10-08 LAB — POCT URINALYSIS DIPSTICK
Bilirubin, UA: NEGATIVE
Glucose, UA: NEGATIVE
Ketones, UA: NEGATIVE
Nitrite, UA: NEGATIVE
Urobilinogen, UA: NEGATIVE
pH, UA: 5

## 2015-10-08 MED ORDER — SULFAMETHOXAZOLE-TRIMETHOPRIM 800-160 MG PO TABS: ORAL_TABLET | ORAL | Status: DC

## 2015-10-08 MED ORDER — MEDROXYPROGESTERONE ACETATE 10 MG PO TABS: 10.0000 mg | ORAL_TABLET | Freq: Every day | ORAL | Status: DC

## 2015-10-08 NOTE — Progress Notes (Signed)
55 y.o. G0P0 Married  Caucasian Fe here for annual exam.  Patient took Provera challenge last April after AEX.  She did have bleeding from May 5 - 5/25.  Most of days were spotting to very light.  2 days 5/12 -13 were heavy.  Pt has calendar and will be scanned. She did not have any other symptoms since then that she was going to start a menses.  She has no vaso symptoms other than related to thyroid med's.  Denies any UA symptoms today.  Patient's last menstrual period was 10/06/2013 (exact date).          Sexually active: Yes.    The current method of family planning is post menopausal status.    Exercising: Yes.    Walking, treadmill, weight equipment, stationary bike Smoker:  no  Health Maintenance: Pap:  10/02/14 Neg. HR HPV:neg MMG:  12/12/14 BIRADS1:neg Colonoscopy:  Never TDaP:  10/02/14  Hep C and HIV: done today Labs: PCP UA: WBC=Large, RBC=Small, Protein=Trace   reports that she has never smoked. She has never used smokeless tobacco. She reports that she does not drink alcohol or use illicit drugs.  Past Medical History  Diagnosis Date  . Infertility, female   . Menstrual migraine   . Recurrent UTI   . Thyroid disease 2008  . Hypertension 2011 ?    Past Surgical History  Procedure Laterality Date  . Cholecystectomy, laparoscopic  10/07  . Wisdom tooth extraction  age 62    Current Outpatient Prescriptions  Medication Sig Dispense Refill  . Calcium Carbonate-Vitamin D (CALCIUM-VITAMIN D) 500-200 MG-UNIT per tablet Take 1 tablet by mouth 2 (two) times daily with a meal.    . Cholecalciferol (VITAMIN D PO) Take 1,000 Int'l Units by mouth daily.     Marland Kitchen CRANBERRY PO Take by mouth daily.     Marland Kitchen FIBER PO Take by mouth daily.     Marland Kitchen levothyroxine (SYNTHROID, LEVOTHROID) 88 MCG tablet Take 88 mcg by mouth daily before breakfast.    . losartan (COZAAR) 50 MG tablet Take 1 tablet by mouth daily.    . Multiple Vitamin (MULTIVITAMIN) tablet Take 1 tablet by mouth daily.    .  Probiotic Product (PROBIOTIC PO) Take by mouth daily.     Marland Kitchen sulfamethoxazole-trimethoprim (BACTRIM DS,SEPTRA DS) 800-160 MG tablet Take Post coital prn 30 tablet 12  . medroxyPROGESTERone (PROVERA) 10 MG tablet Take 1 tablet (10 mg total) by mouth daily. 10 tablet 0   No current facility-administered medications for this visit.    Family History  Problem Relation Age of Onset  . Stroke Mother     mini stroke  . Heart disease Mother   . Diabetes Father   . Heart disease Father     CHF  . Kidney failure Father   . Heart disease Maternal Grandfather   . COPD Maternal Grandmother     ROS:  Pertinent items are noted in HPI.  Otherwise, a comprehensive ROS was negative.  Exam:   BP 120/76 mmHg  Pulse 82  Resp 16  Ht 5\' 4"  (1.626 m)  Wt 208 lb (94.348 kg)  BMI 35.69 kg/m2  LMP 10/06/2013 (Exact Date) Height: 5\' 4"  (162.6 cm) Ht Readings from Last 3 Encounters:  10/08/15 5\' 4"  (1.626 m)  10/02/14 5' 4.25" (1.632 m)  08/23/13 5\' 4"  (1.626 m)    General appearance: alert, cooperative and appears stated age Head: Normocephalic, without obvious abnormality, atraumatic Neck: no adenopathy, supple, symmetrical, trachea midline and  thyroid normal to inspection and palpation Lungs: clear to auscultation bilaterally Breasts: normal appearance, no masses or tenderness Heart: regular rate and rhythm Abdomen: soft, non-tender; no masses,  no organomegaly Extremities: extremities normal, atraumatic, no cyanosis or edema Skin: Skin color, texture, turgor normal. No rashes or lesions Lymph nodes: Cervical, supraclavicular, and axillary nodes normal. No abnormal inguinal nodes palpated Neurologic: Grossly normal   Pelvic: External genitalia:  no lesions              Urethra:  normal appearing urethra with no masses, tenderness or lesions              Bartholin's and Skene's: normal                 Vagina: normal appearing vagina with normal color and discharge, no lesions               Cervix: anteverted              Pap taken: No. Bimanual Exam:  Uterus:  normal size, contour, position, consistency, mobility, non-tender              Adnexa: no mass, fullness, tenderness               Rectovaginal: Confirms               Anus:  normal sphincter tone, no lesions  Chaperone present: yes  A:  Well Woman with normal exam  Perimenopausal with hx. irregular cycles off OCP, and current amenorrhea PC UTI uses Septra prn History of hypothyroid, infertility, HTN, borderline DM At risk for endo hyperplasia with BMI 35.70   P:   Reviewed health and wellness pertinent to exam  Pap smear as above  Mammogram is due 11/2015  Will do [place] and Vit d ( she is only taking Vit D 1000 IU once a week)  Will follow with labs  Will refer to Dr. Collene Mares for colonoscopy  Will give her another Provera challenge - she is asked to call back with results  Counseled on breast self exam, mammography screening, adequate intake of calcium and vitamin D, diet and exercise, Kegel's exercises return annually or prn  An After Visit Summary was printed and given to the patient.

## 2015-10-08 NOTE — Patient Instructions (Signed)

## 2015-10-09 LAB — URINALYSIS, MICROSCOPIC ONLY
Bacteria, UA: NONE SEEN [HPF]
Casts: NONE SEEN [LPF]
Crystals: NONE SEEN [HPF]
WBC, UA: >60 WBC/HPF — AB (ref <=5)
Yeast: NONE SEEN [HPF]

## 2015-10-09 LAB — VITAMIN D 25 HYDROXY (VIT D DEFICIENCY, FRACTURES): Vit D, 25-Hydroxy: 35 ng/mL (ref 30–100)

## 2015-10-09 LAB — URINE CULTURE
Colony Count: NO GROWTH
Organism ID, Bacteria: NO GROWTH

## 2015-10-09 LAB — HEPATITIS C ANTIBODY: HCV Ab: NEGATIVE

## 2015-10-09 LAB — HIV ANTIBODY (ROUTINE TESTING W REFLEX): HIV 1&2 Ab, 4th Generation: NONREACTIVE

## 2015-10-09 LAB — FOLLICLE STIMULATING HORMONE: FSH: 82.1 m[IU]/mL

## 2015-10-11 NOTE — Progress Notes (Signed)
Encounter reviewed by Dr. Brook Amundson C. Silva.  

## 2015-10-16 ENCOUNTER — Telehealth: Payer: Self-pay | Admitting: Obstetrics and Gynecology

## 2015-10-16 NOTE — Telephone Encounter (Signed)
Spoke with patient. Advised of results as seen below from [place] and [place], [place]. She is agreeable and verbalizes understanding of all results. Aware it may take up to 2 weeks after completion of Provera to have any bleeding if she is going to. Advised to monitor for 2 weeks after her last dose and to contact the office with results. She is agreeable and verbalizes understanding.  Notes Recorded by Regina Eck, CNM on 10/10/2015 at 8:05 AM Notify patient that urine culture showed no growth, no UTI noted. Need to increase water intake daily for prevention. Notes Recorded by [place], FNP on 10/09/2015 at 9:11 AM Please let pt know that [facility] is elevated at 82.1 - very important that she let us know if she gets any bleed from the Provera Challenge as the [facility] shows menopausal range. The Hep C and HIV were negative as expected. Vit D was 35 which is still not where we would like her to be at 96. She is taking only 1000 IU once a week - have her to increase OTC Vit 1000 IU to daily. Urine does show a lot of WBC but culture is pending - will send those results when back.

## 2015-10-16 NOTE — Telephone Encounter (Signed)
Patient is calling to get her results °

## 2016-01-01 ENCOUNTER — Other Ambulatory Visit: Payer: Self-pay | Admitting: Nurse Practitioner

## 2016-01-01 DIAGNOSIS — Z1231 Encounter for screening mammogram for malignant neoplasm of breast: Secondary | ICD-10-CM

## 2016-01-07 ENCOUNTER — Ambulatory Visit
Admission: RE | Admit: 2016-01-07 | Discharge: 2016-01-07 | Disposition: A | Discharge status: Home / Self Care | Payer: BLUE CROSS/BLUE SHIELD | Source: Ambulatory Visit | Attending: Nurse Practitioner | Admitting: Nurse Practitioner

## 2016-01-07 DIAGNOSIS — Z1231 Encounter for screening mammogram for malignant neoplasm of breast: Secondary | ICD-10-CM

## 2016-01-13 ENCOUNTER — Telehealth: Payer: Self-pay | Admitting: Obstetrics and Gynecology

## 2016-01-13 NOTE — Telephone Encounter (Signed)
Patient took provera and still no cycle. She would like to speak with the nurse.

## 2016-01-13 NOTE — Telephone Encounter (Signed)
Spoke with patient. Patient states that she completed her Provera 10 mg x 10 days in April 2017. Reports she has not had any bleeding. Patient had her [place] level checked on 10/09/2015 which was 82.1 and in the menopausal range. Patient's LMP was 10/06/2013. Advised patient she does not need to do anything further at this time. Advised it is okay that she did not have any bleeding following taking Provera as her labs indicate she is into menopause. Advised it is important that she monitor for any further bleeding as we do not expect her to have any further bleeding at this time. If she has any vaginal bleeding she will need to contact the office for evaluation. She is agreeable. Advised I will send a message to [place], FNP and return call if she has any additional recommendations. She is agreeable.  [place], FNP anything further for this patient?

## 2016-01-14 NOTE — Telephone Encounter (Signed)
No further information, looks like she is aware of calling if changes or vaginal bleeding.

## 2016-03-31 ENCOUNTER — Ambulatory Visit
Admission: RE | Admit: 2016-03-31 | Discharge: 2016-03-31 | Disposition: A | Discharge status: Home / Self Care | Payer: BLUE CROSS/BLUE SHIELD | Source: Ambulatory Visit | Attending: Endocrinology | Admitting: Endocrinology

## 2016-03-31 DIAGNOSIS — E049 Nontoxic goiter, unspecified: Secondary | ICD-10-CM

## 2016-04-24 DIAGNOSIS — M722 Plantar fascial fibromatosis: Secondary | ICD-10-CM | POA: Insufficient documentation

## 2016-04-24 DIAGNOSIS — M25531 Pain in right wrist: Secondary | ICD-10-CM | POA: Insufficient documentation

## 2016-10-13 ENCOUNTER — Encounter: Payer: Self-pay | Admitting: Nurse Practitioner

## 2016-10-13 ENCOUNTER — Ambulatory Visit (INDEPENDENT_AMBULATORY_CARE_PROVIDER_SITE_OTHER): Payer: 59 | Admitting: Nurse Practitioner

## 2016-10-13 VITALS — BP 120/76 | HR 64 | Ht 63.75 in | Wt 204.0 lb

## 2016-10-13 DIAGNOSIS — E039 Hypothyroidism, unspecified: Secondary | ICD-10-CM

## 2016-10-13 DIAGNOSIS — E559 Vitamin D deficiency, unspecified: Secondary | ICD-10-CM | POA: Diagnosis not present

## 2016-10-13 DIAGNOSIS — Z0000 Encounter for general adult medical examination without abnormal findings: Secondary | ICD-10-CM

## 2016-10-13 DIAGNOSIS — Z01411 Encounter for gynecological examination (general) (routine) with abnormal findings: Secondary | ICD-10-CM | POA: Diagnosis not present

## 2016-10-13 DIAGNOSIS — E041 Nontoxic single thyroid nodule: Secondary | ICD-10-CM | POA: Diagnosis not present

## 2016-10-13 MED ORDER — SULFAMETHOXAZOLE-TRIMETHOPRIM 800-160 MG PO TABS: ORAL_TABLET | ORAL | 12 refills | Status: DC

## 2016-10-13 NOTE — Patient Instructions (Signed)

## 2016-10-13 NOTE — Progress Notes (Signed)
Patient ID: Melanie Vaughn, female   DOB: Jan 24, 1961, 56 y.o.   MRN: [id number]  55 y.o. G0P0000 Married  Caucasian Fe here for annual exam.  Took Provera challenge 09/2015 and did not get any withdrawal.  [place] on 10/08/15 was 82.1.  No bleeding or spotting since then.  Pt to call if any vaginal bleeding. Last Vit D at 2.  Dr. Chalmers Cater = 03/31/16 THYROID US IMPRESSION: Stable right thyroid nodule measuring up to 1.1 cm. This nodule does not meet criteria for biopsy or dedicated follow-up.  Patient's last menstrual period was 10/06/2013 (exact date).          Sexually active: Yes.    The current method of family planning is post menopausal status.    Exercising: Yes.    stationary bike, walking and weights Smoker:  no  Health Maintenance: Pap: 10/02/14, Negative with neg HR HPV  08/13/11, Negative with neg HR HPV History of Abnormal Pap: yes, in early 20's MMG: 01/07/16, 3D-no, Density Category B, Bi-Rads 1:  Negative Self Breast exams: yes Colonoscopy: 12/31/2015, normal, repeat in 10 years (Dr. Collene Mares) BMD: Never TDaP: 10/02/14 Hep C and HIV: 10/08/15 Labs: PCP takes care of all screening labs, we follow Vitamin D   reports that she has never smoked. She has never used smokeless tobacco. She reports that she does not drink alcohol or use drugs.  Past Medical History:  Diagnosis Date  . Hypertension 2011 ?  Marland Kitchen Infertility, female   . Menstrual migraine   . Recurrent UTI   . Thyroid disease 2008    Past Surgical History:  Procedure Laterality Date  . CHOLECYSTECTOMY, LAPAROSCOPIC  10/07  . WISDOM TOOTH EXTRACTION  age 70    Current Outpatient Prescriptions  Medication Sig Dispense Refill  . Calcium Carbonate-Vitamin D (CALCIUM-VITAMIN D) 500-200 MG-UNIT per tablet Take 1 tablet by mouth 2 (two) times daily with a meal.    . Cholecalciferol (VITAMIN D PO) Take 1,000 Int'l Units by mouth daily.     Marland Kitchen CRANBERRY PO Take by mouth daily.     . Digestive Aids Mixture (PROTEOLYTIC ENZYMES  PO) Take 3 tablets by mouth daily.    Marland Kitchen levothyroxine (SYNTHROID, LEVOTHROID) 88 MCG tablet Take 88 mcg by mouth daily before breakfast.    . losartan (COZAAR) 50 MG tablet Take 1 tablet by mouth daily.    . Multiple Vitamin (MULTIVITAMIN) tablet Take 1 tablet by mouth daily.    . Probiotic Product (PROBIOTIC PO) Take by mouth daily.     Marland Kitchen sulfamethoxazole-trimethoprim (BACTRIM DS,SEPTRA DS) 800-160 MG tablet Take Post coital prn 30 tablet 12   No current facility-administered medications for this visit.     Family History  Problem Relation Age of Onset  . Stroke Mother     mini stroke  . Heart disease Mother   . Diabetes Father   . Heart disease Father     CHF  . Kidney failure Father   . Heart disease Maternal Grandfather   . COPD Maternal Grandmother     ROS:  Pertinent items are noted in HPI.  Otherwise, a comprehensive ROS was negative.  Exam:   BP 120/76 (BP Location: Right Arm, Patient Position: Sitting, Cuff Size: Normal)   Pulse 64   Ht 5' 3.75" (1.619 m)   Wt 204 lb (92.5 kg)   LMP 10/06/2013 (Exact Date)   BMI 35.29 kg/m  Height: 5' 3.75" (161.9 cm) Ht Readings from Last 3 Encounters:  10/13/16 5' 3.75" (1.619  m)  10/08/15 5\' 4"  (1.626 m)  10/02/14 5' 4.25" (1.632 m)    General appearance: alert, cooperative and appears stated age Head: Normocephalic, without obvious abnormality, atraumatic Neck: no adenopathy, supple, symmetrical, trachea midline and thyroid normal to inspection and palpation Lungs: clear to auscultation bilaterally Breasts: normal appearance, no masses or tenderness Heart: regular rate and rhythm Abdomen: soft, non-tender; no masses,  no organomegaly Extremities: extremities normal, atraumatic, no cyanosis or edema Skin: Skin color, texture, turgor normal. No rashes or lesions Lymph nodes: Cervical, supraclavicular, and axillary nodes normal. No abnormal inguinal nodes palpated Neurologic: Grossly normal   Pelvic: External genitalia:   no lesions              Urethra:  normal appearing urethra with no masses, tenderness or lesions              Bartholin's and Skene's: normal                 Vagina: normal appearing vagina with normal color and discharge, no lesions              Cervix: anteverted              Pap taken: No. Bimanual Exam:  Uterus:  normal size, contour, position, consistency, mobility, non-tender              Adnexa: no mass, fullness, tenderness               Rectovaginal: Confirms               Anus:  normal sphincter tone, no lesions  Chaperone present: yes  A:  Well Woman with normal exam  Menopausal - no HRT PC UTI uses Septra prn History of hypothyroid, infertility, HTN, borderline DM At risk for endo hyperplasia with BMI 35.29   P:   Reviewed health and wellness pertinent to exam  Pap smear: no  Mammogram is due 7/18  Refill Septra to use prn post coital  Will follow with Vit D   Counseled on breast self exam, mammography screening, adequate intake of calcium and vitamin D, diet and exercise, Kegel's exercises return annually or prn  An After Visit Summary was printed and given to the patient.

## 2016-10-14 LAB — VITAMIN D 25 HYDROXY (VIT D DEFICIENCY, FRACTURES): Vit D, 25-Hydroxy: 36 ng/mL (ref 30–100)

## 2016-10-14 NOTE — Progress Notes (Signed)
Encounter reviewed by Dr. Jumaane Weatherford Amundson C. Silva.  

## 2016-11-24 ENCOUNTER — Other Ambulatory Visit: Payer: Self-pay | Admitting: Nurse Practitioner

## 2016-11-24 DIAGNOSIS — Z1231 Encounter for screening mammogram for malignant neoplasm of breast: Secondary | ICD-10-CM

## 2017-01-11 ENCOUNTER — Ambulatory Visit
Admission: RE | Admit: 2017-01-11 | Discharge: 2017-01-11 | Disposition: A | Discharge status: Home / Self Care | Payer: 59 | Source: Ambulatory Visit | Attending: Nurse Practitioner | Admitting: Nurse Practitioner

## 2017-01-11 DIAGNOSIS — Z1231 Encounter for screening mammogram for malignant neoplasm of breast: Secondary | ICD-10-CM

## 2017-10-15 ENCOUNTER — Ambulatory Visit: Payer: 59 | Admitting: Nurse Practitioner

## 2017-10-15 ENCOUNTER — Other Ambulatory Visit (HOSPITAL_COMMUNITY)
Admission: RE | Admit: 2017-10-15 | Discharge: 2017-10-15 | Disposition: A | Discharge status: Home / Self Care | Payer: 59 | Source: Ambulatory Visit | Attending: Certified Nurse Midwife | Admitting: Certified Nurse Midwife

## 2017-10-15 ENCOUNTER — Ambulatory Visit (INDEPENDENT_AMBULATORY_CARE_PROVIDER_SITE_OTHER): Payer: 59 | Admitting: Certified Nurse Midwife

## 2017-10-15 ENCOUNTER — Encounter: Payer: Self-pay | Admitting: Certified Nurse Midwife

## 2017-10-15 VITALS — BP 118/68 | HR 60 | Temp 98.5°F | Resp 16 | Ht 63.75 in | Wt 204.0 lb

## 2017-10-15 DIAGNOSIS — Z124 Encounter for screening for malignant neoplasm of cervix: Secondary | ICD-10-CM | POA: Insufficient documentation

## 2017-10-15 DIAGNOSIS — N390 Urinary tract infection, site not specified: Secondary | ICD-10-CM

## 2017-10-15 DIAGNOSIS — Z01411 Encounter for gynecological examination (general) (routine) with abnormal findings: Secondary | ICD-10-CM

## 2017-10-15 DIAGNOSIS — N950 Postmenopausal bleeding: Secondary | ICD-10-CM | POA: Diagnosis not present

## 2017-10-15 DIAGNOSIS — R319 Hematuria, unspecified: Secondary | ICD-10-CM | POA: Diagnosis not present

## 2017-10-15 DIAGNOSIS — Z0000 Encounter for general adult medical examination without abnormal findings: Secondary | ICD-10-CM

## 2017-10-15 LAB — POCT URINALYSIS DIPSTICK
Bilirubin, UA: NEGATIVE
Glucose, UA: NEGATIVE
Ketones, UA: NEGATIVE
Nitrite, UA: NEGATIVE
Protein, UA: NEGATIVE
Urobilinogen, UA: NEGATIVE E.U./dL — AB
pH, UA: 5 (ref 5.0–8.0)

## 2017-10-15 MED ORDER — SULFAMETHOXAZOLE-TRIMETHOPRIM 800-160 MG PO TABS: ORAL_TABLET | ORAL | 12 refills | Status: DC

## 2017-10-15 NOTE — Progress Notes (Signed)
57 y.o. G0P0000 Married  Caucasian Fe here for annual exam. Menopausal no HRT, experienced some vaginal spotting enough to wear a pad for 3 days in February, no crampin, none since then.  Sees PCP prn, labs and aex, and Dr. Chalmers Cater for Hypothyroid/hyypertension management. Saw cardiology baseline evaluation, with labs done there, all clear. Complaining of urinary burning, no increase frequency.Denies fever, chills or back pain. Has post coital Bactrim DS to use and has taken 3 doses in a week period of time with frequency better, but burning continued. Denies fever,chills or back pain. "Just has not gone away". Denies vaginal dryness. Did not note any vaginal bleeding with sexual activity. No other health issues today.  Patient's last menstrual period was 10/06/2013 (exact date).          Sexually active: Yes.    The current method of family planning is post menopausal status.    Exercising: Yes.    walking & weights Smoker:  no  Health Maintenance: Pap:  10-02-14 neg HPV HR neg History of Abnormal Pap: yes years ago MMG:  01-11-17 category b density birads 1:neg Self Breast exams: yes Colonoscopy:  2017 f/u 85yrs BMD:   none TDaP:  2016 Shingles: no Pneumonia: no Hep C and HIV: both neg 2017 Labs: poct urine- wbc 2+, rbc tr Flu vaccine: yes   reports that she has never smoked. She has never used smokeless tobacco. She reports that she does not drink alcohol or use drugs.  Past Medical History:  Diagnosis Date  . Hypertension 2011 ?  Marland Kitchen Infertility, female   . Menstrual migraine   . Recurrent UTI   . Thyroid disease 2008    Past Surgical History:  Procedure Laterality Date  . CHOLECYSTECTOMY, LAPAROSCOPIC  10/07  . WISDOM TOOTH EXTRACTION  age 73    Current Outpatient Medications  Medication Sig Dispense Refill  . Calcium Carbonate-Vitamin D (CALCIUM-VITAMIN D) 500-200 MG-UNIT per tablet Take 1 tablet by mouth 2 (two) times daily with a meal.    . Cholecalciferol (VITAMIN D PO)  Take 1,000 Int'l Units by mouth daily.     Marland Kitchen CRANBERRY PO Take by mouth daily.     . Digestive Aids Mixture (PROTEOLYTIC ENZYMES PO) Take 3 tablets by mouth daily.    Marland Kitchen levothyroxine (SYNTHROID, LEVOTHROID) 88 MCG tablet Take 88 mcg by mouth daily before breakfast.    . losartan (COZAAR) 50 MG tablet Take 1 tablet by mouth daily.    . Multiple Vitamin (MULTIVITAMIN) tablet Take 1 tablet by mouth daily.    . Probiotic Product (PROBIOTIC PO) Take by mouth daily.     Marland Kitchen sulfamethoxazole-trimethoprim (BACTRIM DS,SEPTRA DS) 800-160 MG tablet Take Post coital prn 30 tablet 12   No current facility-administered medications for this visit.     Family History  Problem Relation Age of Onset  . Stroke Mother        mini stroke  . Heart disease Mother   . Diabetes Father   . Heart disease Father        CHF  . Kidney failure Father   . Heart disease Maternal Grandfather   . COPD Maternal Grandmother     ROS:  Pertinent items are noted in HPI.  Otherwise, a comprehensive ROS was negative.  Exam:   BP 118/68   Pulse 60   Temp 98.5 F (36.9 C) (Oral)   Resp 16   Ht 5' 3.75" (1.619 m)   Wt 204 lb (92.5 kg)   LMP  10/06/2013 (Exact Date) Comment: spotting 2/19  BMI 35.29 kg/m  Height: 5' 3.75" (161.9 cm) Ht Readings from Last 3 Encounters:  10/15/17 5' 3.75" (1.619 m)  10/13/16 5' 3.75" (1.619 m)  10/08/15 5\' 4"  (1.626 m)    General appearance: alert, cooperative and appears stated age Head: Normocephalic, without obvious abnormality, atraumatic Neck: no adenopathy, supple, symmetrical, trachea midline and thyroid normal to inspection and palpation and nodular Lungs: clear to auscultation bilaterally CVAT: bilateral negative Breasts: normal appearance, no masses or tenderness, No nipple retraction or dimpling, No nipple discharge or bleeding, No axillary or supraclavicular adenopathy Heart: regular rate and rhythm Abdomen: soft, non-tender; no masses,  no organomegaly Extremities:  extremities normal, atraumatic, no cyanosis or edema Skin: Skin color, texture, turgor normal. No rashes or lesions, warm and dry Lymph nodes: Cervical, supraclavicular, and axillary nodes normal. No abnormal inguinal nodes palpated Neurologic: Grossly normal   Pelvic: External genitalia:  no lesions,  Normal appearance,               Urethra:  normal appearing urethra with no masses, tenderness or lesions  Bladder: positive suprapubic tenderness  Urethral meatus :red, tender              Bartholin's and Skene's: normal                 Vagina: normal appearing vagina with normal color and discharge, no lesions              Cervix: stenotic with membraneous female on digital exam, non tender, friable with pap smear, no CMT              Pap taken: Yes.   Bimanual Exam:  Uterus:  normal size, contour, position, consistency, mobility, non-tender              Adnexa: normal adnexa and no mass, fullness, tenderness               Rectovaginal: Confirms               Anus:  normal sphincter tone, no lesions  Chaperone present: yes  A:  Well Woman with normal exam  Menopausal no HRT  PMB, stenotic cervix  UTI, post coital history  Hypertension/hypothyroid with endocrine management  Obesity  P:   Reviewed health and wellness pertinent to exam  Discussed importance of advising if vaginal bleeding. Discussed need to evaluate with PUS and possible endometrial biopsy due to concerns with hyperplasia. Patient will be called with insurance info and scheduled with MD. Questions addressed.   Discussed UTI finding and need to treat. Warning signs with UTI given and need to increase water intake.  Rx Bactrim DS bid x 3 days and post coital as instructed  See order  Urine culture  Continue follow up with MD as indicated  Stressed weight management for best health.  Pap smear: yes   counseled on breast self exam, mammography screening, STD prevention, HIV risk factors and prevention, feminine  hygiene, adequate intake of calcium and vitamin D, diet and exercise  return annually or prn  An After Visit Summary was printed and given to the patient.

## 2017-10-15 NOTE — Patient Instructions (Signed)

## 2017-10-16 LAB — URINE CULTURE

## 2017-10-18 ENCOUNTER — Telehealth: Payer: Self-pay | Admitting: Obstetrics & Gynecology

## 2017-10-18 NOTE — Telephone Encounter (Signed)
Spoke with patient regarding benefit for an ultrasound with possible endometrial biopsy. Patient understood and agreeable. Patient ready to schedule. Patient scheduled 10/28/17 with Dr Sabra Heck. Patient aware of appoinmtent date, arrival time and cancellation policy. No further questions.   Will close encounter  Forwarding to Dr Sabra Heck for final review  cc: Melvia Heaps, CNM

## 2017-10-19 LAB — CYTOLOGY - PAP
Diagnosis: NEGATIVE
HPV: NOT DETECTED

## 2017-10-28 ENCOUNTER — Ambulatory Visit (INDEPENDENT_AMBULATORY_CARE_PROVIDER_SITE_OTHER): Payer: 59

## 2017-10-28 ENCOUNTER — Other Ambulatory Visit: Payer: Self-pay | Admitting: *Deleted

## 2017-10-28 ENCOUNTER — Ambulatory Visit (INDEPENDENT_AMBULATORY_CARE_PROVIDER_SITE_OTHER): Payer: 59 | Admitting: Obstetrics & Gynecology

## 2017-10-28 VITALS — BP 142/96 | HR 76 | Resp 16 | Ht 63.75 in | Wt 204.0 lb

## 2017-10-28 DIAGNOSIS — N950 Postmenopausal bleeding: Secondary | ICD-10-CM | POA: Diagnosis not present

## 2017-10-28 DIAGNOSIS — N83202 Unspecified ovarian cyst, left side: Secondary | ICD-10-CM

## 2017-10-28 DIAGNOSIS — N841 Polyp of cervix uteri: Secondary | ICD-10-CM

## 2017-10-28 DIAGNOSIS — N83201 Unspecified ovarian cyst, right side: Secondary | ICD-10-CM

## 2017-10-28 NOTE — Patient Instructions (Addendum)
Today on ultrasound, we clearly see an endocervical polyp.  I think this is the cause of the bleeding that you experienced.  The endometrium is thin.  This is where endometrial cancers develop and there is no evidence of that today.  Both ovaries are mildly enlarged with multiple cysts on them.  They do not have increased vascularity.  I do want to check a tumor marker called a Ca-125 today.  If this is normal, I may want to proceed with a pelvic MRI.    I will want to repeat an ultrasound in 3 months.

## 2017-10-28 NOTE — Progress Notes (Signed)
57 y.o. G41P0000 Married Caucasian female here for pelvic ultrasound due to episode of PMP bleeding in February.  This was heavy enough that pt needed to wear a pad.  She discussed this with French Ana at her [facility] 10/15/17.  She's had no bleeding since.  Denies pelvic pain.    Patient's last menstrual period was 10/06/2013 (exact date).  Contraception: PMP  Findings:  UTERUS: 6.3 x 3.9 x 3.7cm with 1.2 x 1.5cm intramural fibroid EMS: 2.58mm, endocervical polyp noted ADNEXA: Left ovary:  3. X 3.5 x 2.9cm multiple cysts, internal debris and low level echoes.  All avascular.   Right ovary: 3.8 x 2.1 x 2.5cm multiple cysts noted with the largest measuring 2.3cm with possible internal septation.  Minimal internal echoes.  Cysts are avascular. CUL DE SAC: no free fluid  Discussion:  Findings reviewed with pt including cervical polyp and multicystic ovarian findings.  Feel ca-125 should be obtained today.  If this is normal, will consider MRI as ovaries do have multiple cysts and this is not typical for PMP ovaries.  Questions answered.   Assessment:  Multicystic ovaries Cervical polyp  Plan:  Ca-125 will be obtained today.  Consider MRI.  Will at least need follow-up ultrasound in 3 months.   ~20 minutes spent with patient >50% of time was in face to face discussion of above.

## 2017-10-29 LAB — CA 125: Cancer Antigen (CA) 125: 12.8 U/mL (ref 0.0–38.1)

## 2017-10-31 ENCOUNTER — Encounter: Payer: Self-pay | Admitting: Obstetrics & Gynecology

## 2017-11-01 ENCOUNTER — Telehealth: Payer: Self-pay

## 2017-11-01 DIAGNOSIS — Z780 Asymptomatic menopausal state: Secondary | ICD-10-CM

## 2017-11-01 DIAGNOSIS — D4959 Neoplasm of unspecified behavior of other genitourinary organ: Secondary | ICD-10-CM

## 2017-11-01 NOTE — Telephone Encounter (Signed)
Left detailed message that lab testing was normal and to please return call to discuss further recommendations for evaluation.

## 2017-11-01 NOTE — Telephone Encounter (Signed)
-----   Message from Megan Salon, MD sent at 11/01/2017  7:19 AM EDT ----- Please let pt know her ca-125 was normal.  Due to bilateral multicystic, septate ovarian cysts, I would like to proceed with pelvic MRI.  Can you please see if this can be pre-certed and scheduled.  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  Thanks.  Cc:  Lerry Liner

## 2017-11-02 NOTE — Telephone Encounter (Signed)
Spoke with patient. Advised of results and message as seen below from [place]. Patient verbalizes understanding. Order for pelvic MRI w wo contrast ordered. Will need pre-authorization before scheduling.  Cc: Lerry Liner

## 2017-11-11 NOTE — Telephone Encounter (Signed)
Referral for pelvic MRI w wo contrast has been authorized. The approval number is [id number]. The approval is valid 11/10/17 through 12/25/17.  I have spoken with Mardene Celeste with [facility] to convey this information. Per [facility] is on scheduling the appointment

## 2017-11-13 DIAGNOSIS — N809 Endometriosis, unspecified: Secondary | ICD-10-CM

## 2017-11-13 DIAGNOSIS — N80129 Deep endometriosis of ovary, unspecified ovary: Secondary | ICD-10-CM

## 2017-11-13 HISTORY — DX: Deep endometriosis of ovary, unspecified ovary: N80.129

## 2017-11-13 HISTORY — DX: Endometriosis, unspecified: N80.9

## 2017-11-17 NOTE — Telephone Encounter (Signed)
Patient is scheduled for recommended pelvic MRI on 11/21/17 at 1:10 PM with [place].   Routing to provider for final review. Will Close encounter

## 2017-11-21 ENCOUNTER — Ambulatory Visit
Admission: RE | Admit: 2017-11-21 | Discharge: 2017-11-21 | Disposition: A | Discharge status: Home / Self Care | Payer: 59 | Source: Ambulatory Visit | Attending: Obstetrics & Gynecology | Admitting: Obstetrics & Gynecology

## 2017-11-21 DIAGNOSIS — D4959 Neoplasm of unspecified behavior of other genitourinary organ: Secondary | ICD-10-CM

## 2017-11-21 DIAGNOSIS — Z780 Asymptomatic menopausal state: Secondary | ICD-10-CM

## 2017-11-21 MED ORDER — GADOBENATE DIMEGLUMINE 529 MG/ML IV SOLN
18.0000 mL | Freq: Once | INTRAVENOUS | Status: AC | PRN
  Administered 2017-11-21: 18 mL via INTRAVENOUS

## 2017-12-01 ENCOUNTER — Other Ambulatory Visit: Payer: Self-pay | Admitting: Obstetrics & Gynecology

## 2017-12-01 DIAGNOSIS — Z1231 Encounter for screening mammogram for malignant neoplasm of breast: Secondary | ICD-10-CM

## 2017-12-20 ENCOUNTER — Telehealth: Payer: Self-pay | Admitting: Obstetrics & Gynecology

## 2017-12-20 DIAGNOSIS — N9489 Other specified conditions associated with female genital organs and menstrual cycle: Secondary | ICD-10-CM

## 2017-12-20 DIAGNOSIS — Z780 Asymptomatic menopausal state: Secondary | ICD-10-CM

## 2017-12-20 NOTE — Telephone Encounter (Signed)
Patient called to check on the status of her referral to a GYN Oncologist.

## 2017-12-20 NOTE — Telephone Encounter (Signed)
Spoke with patient. Patient requesting to proceed with referral to GYN oncology for further evaluation of bilateral ovarian cyst. Patient ask if she will return to Dr. Sabra Heck for surgery if she desires to proceed with surgical treatment for removal of ovaries? Advised will review with Dr. Sabra Heck and return call, patient agreeable.   Dr. Sabra Heck -please review, ok to proceed with referral to GYN Oncology?

## 2017-12-21 NOTE — Telephone Encounter (Signed)
Spoke with patient, advised as seen below per Dr. Sabra Heck. Order placed for GYN Oncology referral. Patient aware she will be contacted by [facility] referral coordinator with appt details. Patient verbalizes understanding and is agreeable.   Routing to [employer] , Teaching laboratory technician.   Encounter closed.

## 2017-12-21 NOTE — Telephone Encounter (Signed)
Please proceed with referral.  Bilateral complex adnexal masses in PMP female.  She will have surgery done with gyn/onc.

## 2018-01-03 ENCOUNTER — Telehealth: Payer: Self-pay | Admitting: Obstetrics & Gynecology

## 2018-01-03 ENCOUNTER — Encounter: Payer: Self-pay | Admitting: Gynecologic Oncology

## 2018-01-03 ENCOUNTER — Inpatient Hospital Stay: Payer: 59 | Attending: Gynecologic Oncology | Admitting: Gynecologic Oncology

## 2018-01-03 VITALS — BP 150/77 | HR 68 | Temp 98.5°F | Resp 20 | Ht 64.0 in | Wt 203.5 lb

## 2018-01-03 DIAGNOSIS — N83201 Unspecified ovarian cyst, right side: Secondary | ICD-10-CM

## 2018-01-03 DIAGNOSIS — N83202 Unspecified ovarian cyst, left side: Principal | ICD-10-CM

## 2018-01-03 NOTE — Progress Notes (Signed)
Consult Note: Gyn-Onc  Consult was requested by Dr. Sabra Heck for the evaluation of Genevieve Arbaugh Lee-Nix 57 y.o. female  CC:  Chief Complaint  Patient presents with  . bilateral ovarian cysts    Assessment/Plan:  Ms. Runette Scifres Lee-Nix  is a 57 y.o.  year old with bilateral ovarian cysts, normal CA125 and a history of postmenopausal bleeding.  After reviewing her records and reviewing her images from her ultrasound I feel that Leahmarie has a low likelihood of harboring a malignancy in either ovary.  The left ovary has some enhancing mural uptake on MR however it smooth regular appearances in general fairly reassuring, as is the normal tumor marker.  I discussed that the only way to determine whether or not these are malignant is to perform minimally invasive surgery with removal of both tubes and ovaries and her frozen section testing.  I explained that overall she is a good candidate for surgery with relatively low but certainly no guarantee that risks could not occur.  I offered her surgery however the patient has reluctance to undergo surgery unless absolutely necessary.  I believe this is a sensible mindset and am supportive of her desire to not pursue surgery at this time.  Therefore we will I am recommending close surveillance with serial ultrasounds.  I recommend repeating the next ultrasound scan in 3 months time (approximately September 2019).  If that appears stable (less than 50% change in size of the mural nodules with the overall cysts) then the next ultrasound can be scheduled for 6 months following that (March 2020).  A subsequent 6 monthly ultrasound surveillance can be conducted until 2 years of surveillance has been achieved and the cyst shows overall stability.  It is reasonable to perform repeat Ca1 25 draws with those ultrasounds as there is accumulative benefit to the addition of Ca1 25 to ultrasound imaging.  If Estefani changes her mind and desires surgical intervention would be happy to  offer her this with a minimally invasive BSO followed by possible hysterectomy and staging if malignancy is identified.  For now we will hold off this.  She will follow-up with Dr. Sabra Heck for repeat ultrasound imaging as repeated scans at the same location is of highest diagnostic quality.   HPI: Ms Stefan Mila is a 57 year old P0 who is seen in consultation at the request of Dr. Sabra Heck for bilateral ovarian cysts.  The patient has been postmenopausal since 2017.  She experienced an episode for 3 days of postmenopausal bleeding in March 2019.  In May she reported it to her gynecologist, Dr. Sabra Heck, at a routine annual gynecologic evaluation.  Dr. Sabra Heck performed a transvaginal ultrasound scan at that time which revealed a grossly normal-appearing uterus measuring 6.3 x 3.9 x 3.7 cm with a 2.3 mm endometrial thickness.  The left ovary measured 2.9 x 3.56 x 2.95 cm the right ovary measured 3.78 x 2.12 x 2.57 cm.  2 cyst was seen one in the right which measured 23 mm with the appearance of partial septation, all avascular with minimal internal debris.  The left ovary contain multiple cysts with internal debris and low-level echoes all avascular with echogenic septations.  No free fluid was seen.  A Ca1 25 was drawn on Oct 28, 2017 and was normal at 12.8.  An MR of the pelvis was performed on November 21, 2017 to better characterize the ovarian cysts.  This confirmed a small grossly normal-appearing slightly fibroid uterus.  The right ovary contained a  small complex cystic lesion measuring 3.8 cm, with a few septations and a small T1 hyperintense cystic component consistent with hemorrhage.  No enhancing soft tissue nodules were identified on the right.  The left ovary contained a 4.3 cm complex cystic lesion containing multiple internal septations as well as a few small areas of enhancing mural soft tissue nodules.  There is no free fluid ascites carcinomatosis or lymphadenopathy.  The patient otherwise feels  well and these cysts are asymptomatic.  She has no significant family history for malignancy.  Past surgical history is for laparoscopic cholecystectomy.  Past medical history significant for an underactive thyroid, hypertension, and prediabetes which for which she had been treated with metformin but is now off treatment due to diet control.  She had a visit with a cardiologist to have a routine evaluation after her husband developed coronary artery disease, and she was given the all clear by her cardiologist.  She is nulliparous, she had a history of an abnormal Pap smear years ago at approximately age 59 but did not require treatment for this.  Her Pap surveillance has been subsequently normal including most recently in 2019 or her Pap was cytologically normal and negative for high risk HPV.  She was transitioned through menopause in 2017 at the time of her last menses.  Current Meds:  Outpatient Encounter Medications as of 01/03/2018  Medication Sig  . Cholecalciferol (VITAMIN D PO) Take 1,000 Int'l Units by mouth daily.   Marland Kitchen CRANBERRY PO Take by mouth daily.   . Digestive Aids Mixture (PROTEOLYTIC ENZYMES PO) Take 3 tablets by mouth daily.  Marland Kitchen levothyroxine (SYNTHROID, LEVOTHROID) 88 MCG tablet Take 88 mcg by mouth daily before breakfast.  . losartan (COZAAR) 50 MG tablet Take 1 tablet by mouth daily.  . Multiple Vitamin (MULTIVITAMIN) tablet Take 1 tablet by mouth daily.  . Probiotic Product (PROBIOTIC PO) Take by mouth daily.   Marland Kitchen sulfamethoxazole-trimethoprim (BACTRIM DS,SEPTRA DS) 800-160 MG tablet Take Post coital prn  . Calcium Carbonate-Vitamin D (CALCIUM-VITAMIN D) 500-200 MG-UNIT per tablet Take 2 tablets by mouth daily.    No facility-administered encounter medications on file as of 01/03/2018.     Allergy:  Allergies  Allergen Reactions  . Macrodantin [Nitrofurantoin Macrocrystal]     SOB & increased heart rate    Social Hx:   Social History   Socioeconomic History  .  Marital status: Married    Spouse name: Not on file  . Number of children: Not on file  . Years of education: Not on file  . Highest education level: Not on file  Occupational History  . Not on file  Social Needs  . Financial resource strain: Not on file  . Food insecurity:    Worry: Not on file    Inability: Not on file  . Transportation needs:    Medical: Not on file    Non-medical: Not on file  Tobacco Use  . Smoking status: Never Smoker  . Smokeless tobacco: Never Used  Substance and Sexual Activity  . Alcohol use: No  . Drug use: No  . Sexual activity: Yes    Partners: Male    Birth control/protection: Post-menopausal  Lifestyle  . Physical activity:    Days per week: Not on file    Minutes per session: Not on file  . Stress: Not on file  Relationships  . Social connections:    Talks on phone: Not on file    Gets together: Not on file  Attends religious service: Not on file    Active member of club or organization: Not on file    Attends meetings of clubs or organizations: Not on file    Relationship status: Not on file  . Intimate partner violence:    Fear of current or ex partner: Not on file    Emotionally abused: Not on file    Physically abused: Not on file    Forced sexual activity: Not on file  Other Topics Concern  . Not on file  Social History Narrative   Heritage manager at [employer]  Married no children.    Past Surgical Hx:  Past Surgical History:  Procedure Laterality Date  . CHOLECYSTECTOMY, LAPAROSCOPIC  10/07  . COLPOSCOPY    . WISDOM TOOTH EXTRACTION  age 31    Past Medical Hx:  Past Medical History:  Diagnosis Date  . Abnormal Pap smear of cervix   . Hypertension 2011 ?  Marland Kitchen Infertility, female   . Menstrual migraine   . Recurrent UTI   . Thyroid disease 2008    Past Gynecological History:  See HPI Patient's last menstrual period was 10/06/2013 (exact date).  Family Hx:  Family History  Problem Relation Age of  Onset  . Stroke Mother        mini stroke  . Heart disease Mother   . Alzheimer's disease Mother   . Diabetes Father   . Heart disease Father        CHF  . Kidney failure Father   . Heart disease Maternal Grandfather   . COPD Maternal Grandmother     Review of Systems:  Constitutional  Feels well,    ENT Normal appearing ears and nares bilaterally Skin/Breast  No rash, sores, jaundice, itching, dryness Cardiovascular  No chest pain, shortness of breath, or edema  Pulmonary  No cough or wheeze.  Gastro Intestinal  No nausea, vomitting, or diarrhoea. No bright red blood per rectum, no abdominal pain, change in bowel movement, or constipation.  Genito Urinary  No frequency, urgency, dysuria, single episode of bleeding in 2019, March Musculo Skeletal  No myalgia, arthralgia, joint swelling or pain  Neurologic  No weakness, numbness, change in gait,  Psychology  No depression, anxiety, insomnia.   Vitals:  Blood pressure (!) 150/77, pulse 68, temperature 98.5 F (36.9 C), temperature source Oral, resp. rate 20, height 5\' 4"  (1.626 m), weight 203 lb 8 oz (92.3 kg), last menstrual period 10/06/2013, SpO2 98 %.  Physical Exam: WD in NAD Neck  Supple NROM, without any enlargements.  Lymph Node Survey No cervical supraclavicular or inguinal adenopathy Cardiovascular  Pulse normal rate, regularity and rhythm. S1 and S2 normal.  Lungs  Clear to auscultation bilateraly, without wheezes/crackles/rhonchi. Good air movement.  Skin  No rash/lesions/breakdown  Psychiatry  Alert and oriented to person, place, and time  Abdomen  Normoactive bowel sounds, abdomen soft, non-tender and mildly obese without evidence of hernia.  Back No CVA tenderness Genito Urinary  Vulva/vagina: Normal external female genitalia.  No lesions. No discharge or bleeding.  Bladder/urethra:  No lesions or masses, well supported bladder  Vagina: normal  Cervix: Normal appearing, no lesions.  Uterus:   Small, mobile, no parametrial involvement or nodularity.  Adnexa: no palpable masses. Rectal  deferred Extremities  No bilateral cyanosis, clubbing or edema.   Thereasa Solo, MD  01/03/2018, 6:36 PM

## 2018-01-03 NOTE — Telephone Encounter (Signed)
Routing to Dr. Sabra Heck to review.  Okay to order Pelvic ultrasound for September with follow up consult with you?

## 2018-01-03 NOTE — Telephone Encounter (Signed)
Patient called and left a message on our voice mail. She said she went and saw Dr. Everitt Amber for a new patient appointment today. She also said Dr. Denman George wants her to schedule an ultrasound in September.

## 2018-01-03 NOTE — Patient Instructions (Signed)
Please contact Dr Ammie Ferrier office to schedule a follow-up ultrasound in 3 months. She will also draw your blood at that time.  Dr Denman George is recommending a 3 month ultrasound, then repeated again 6 months after that and every 6 months until 2 years out. If the cysts remain stable, ultrasounds can be stopped at that time.  If you change your mind and desire surgical intervention, please speak with Dr Denman George at [phone].

## 2018-01-03 NOTE — Telephone Encounter (Signed)
Dr. Serita Grit note (which is not complete) indicated a 3 month ultrasound from now, which would be in October.  It can be done in September if she desires.  Will need ca-125 on that day as well.  Thanks.

## 2018-01-04 NOTE — Telephone Encounter (Signed)
Spoke with patient, advised as seen below per Dr. Sabra Heck. Patient request to schedule in September. PUS scheduled for 03/03/18 at 3pm, consult to follow at 3:30pm with Dr. Sabra Heck.   Orders placed for PUS and CA-125  Routing to provider for final review. Patient is agreeable to disposition. Will close encounter.   CC: Magdalene Patricia, [address] [employer]

## 2018-01-17 ENCOUNTER — Ambulatory Visit: Payer: 59

## 2018-01-18 ENCOUNTER — Ambulatory Visit
Admission: RE | Admit: 2018-01-18 | Discharge: 2018-01-18 | Disposition: A | Discharge status: Home / Self Care | Payer: 59 | Source: Ambulatory Visit | Attending: Obstetrics & Gynecology | Admitting: Obstetrics & Gynecology

## 2018-01-18 DIAGNOSIS — Z1231 Encounter for screening mammogram for malignant neoplasm of breast: Secondary | ICD-10-CM

## 2018-01-19 ENCOUNTER — Other Ambulatory Visit: Payer: Self-pay | Admitting: Obstetrics & Gynecology

## 2018-01-19 DIAGNOSIS — R928 Other abnormal and inconclusive findings on diagnostic imaging of breast: Secondary | ICD-10-CM

## 2018-01-25 ENCOUNTER — Ambulatory Visit
Admission: RE | Admit: 2018-01-25 | Discharge: 2018-01-25 | Disposition: A | Discharge status: Home / Self Care | Payer: 59 | Source: Ambulatory Visit | Attending: Obstetrics & Gynecology | Admitting: Obstetrics & Gynecology

## 2018-01-25 ENCOUNTER — Other Ambulatory Visit: Payer: Self-pay | Admitting: Obstetrics & Gynecology

## 2018-01-25 DIAGNOSIS — N6489 Other specified disorders of breast: Secondary | ICD-10-CM

## 2018-01-25 DIAGNOSIS — R928 Other abnormal and inconclusive findings on diagnostic imaging of breast: Secondary | ICD-10-CM

## 2018-01-27 ENCOUNTER — Ambulatory Visit
Admission: RE | Admit: 2018-01-27 | Discharge: 2018-01-27 | Disposition: A | Discharge status: Home / Self Care | Payer: 59 | Source: Ambulatory Visit | Attending: Obstetrics & Gynecology | Admitting: Obstetrics & Gynecology

## 2018-01-27 ENCOUNTER — Other Ambulatory Visit: Payer: Self-pay | Admitting: Obstetrics & Gynecology

## 2018-01-27 DIAGNOSIS — N6489 Other specified disorders of breast: Secondary | ICD-10-CM

## 2018-03-03 ENCOUNTER — Ambulatory Visit (INDEPENDENT_AMBULATORY_CARE_PROVIDER_SITE_OTHER): Payer: 59

## 2018-03-03 ENCOUNTER — Ambulatory Visit (INDEPENDENT_AMBULATORY_CARE_PROVIDER_SITE_OTHER): Payer: 59 | Admitting: Obstetrics & Gynecology

## 2018-03-03 VITALS — BP 136/80 | HR 76 | Resp 16 | Ht 64.0 in | Wt 203.0 lb

## 2018-03-03 DIAGNOSIS — N83201 Unspecified ovarian cyst, right side: Secondary | ICD-10-CM | POA: Diagnosis not present

## 2018-03-03 DIAGNOSIS — N83202 Unspecified ovarian cyst, left side: Secondary | ICD-10-CM | POA: Diagnosis not present

## 2018-03-03 DIAGNOSIS — N949 Unspecified condition associated with female genital organs and menstrual cycle: Secondary | ICD-10-CM | POA: Diagnosis not present

## 2018-03-03 DIAGNOSIS — N9489 Other specified conditions associated with female genital organs and menstrual cycle: Secondary | ICD-10-CM

## 2018-03-03 NOTE — Progress Notes (Signed)
57 y.o. G0P0000 Married White or Caucasian female here for pelvic ultrasound to recheck her ovaries.  Pt was initially sent for an ultrasound due to some spotting in 2/19.    Ultrasound was performed 5/16.19 that showed complex bilateral ovarian cysts.  Left ovary measured 3 x 3.5 x 2.9cm wit multiple cysts, internal debris and low level echoes.  The right ovary was 3.8 x 2.1 x 2.5cm with multiple cysts with the largest measuring 2.3cm with possible internal septation.  Minimal internal echoes were noted.  The ovaries were all avascular.  Ca-125 was 12.8.  MRI was performed on 11/21/17.  Right ovary showed a 3.8 complex cystic lesion with hemorrhagic component.  This had a few septations and a small cystic component consistent with hemorrhage.  No enhancing soft tissue nodules were seen.    The left ovary 4.3cm complex cyst with multiple internal septations as well as a few small areas of enhancing mural nodules.    She did see Dr. Denman George in consultation.  She has a low suspicion for malignancy.  She recommended repeating the ultrasound in 3 months.  If ovaries appear stable (less than 50% change in the size of the mural nodules with the overall cysts), then repeat ultrasound was recommenced for 6 months.  Subsequent 6 month ultrasounds recommended until 2 years of surveillance was completed.  Ca-125 recommended as well.    Patient's last menstrual period was 10/06/2013 (exact date).  Contraception: PMP  Findings:  UTERUS: 6.0 x 3.5 x 3.1cm EMS: 2.51mm ADNEXA: Left ovary:  3.5 x 3.8 x 2.5cm with 1.7 x 1.5 x 1.5cm cyst and 0.6 x 0.7cm cyst (these are smaller)       Right ovary: 3.4 x 1.6 x 3.1cm with 1.4 x 1.0cm and 2.0 x 1.6cm and 0.9 x 0.8cm cyst (these are stable) CUL DE SAC: no free fluid  Discussion:  Findings reviewed with pt.  Cysts are stable are smaller.  Will repeat ca-125 today as well.  If this is stable, will repeat in 6 months and continue this for 2 full years.  As mural nodules were  only seen on MRI, will consider MRI at a year if everything is stable between now and then.  Assessment:  Complex ovarian cysts consistent with possible endometrioma on the right and complex 4.3 left ovarian cystic lesion with small enhancing mural nodules  Plan:  Ca-125 will be obtained today.  If stable, plan to repeat ultrasound and ca-125 in six months.  ~15 minutes spent with patient >50% of time was in face to face discussion of above.

## 2018-03-04 LAB — CA 125: Cancer Antigen (CA) 125: 14.4 U/mL (ref 0.0–38.1)

## 2018-03-06 ENCOUNTER — Encounter: Payer: Self-pay | Admitting: Obstetrics & Gynecology

## 2018-03-06 DIAGNOSIS — N9489 Other specified conditions associated with female genital organs and menstrual cycle: Secondary | ICD-10-CM | POA: Insufficient documentation

## 2018-03-07 ENCOUNTER — Telehealth: Payer: Self-pay | Admitting: *Deleted

## 2018-03-07 DIAGNOSIS — N9489 Other specified conditions associated with female genital organs and menstrual cycle: Secondary | ICD-10-CM

## 2018-03-07 NOTE — Telephone Encounter (Signed)
Spoke with patient, advised as seen below per Dr. Sabra Heck. PUS scheduled for 09/01/18 at 3pm, consult at 3:30pm with Dr. Sabra Heck. Order placed for PUS for precert and lab order placed. Patient verbalizes understanding and is agreeable. Encounter closed.

## 2018-03-07 NOTE — Telephone Encounter (Signed)
Patient is returning a call to Jill. °

## 2018-03-07 NOTE — Telephone Encounter (Signed)
-----   Message from Megan Salon, MD sent at 03/04/2018  4:01 PM EDT ----- Please let pt know her Ca 125 was normal at 14.4.  Needs repeat PUS and ca-125 in 6 months . Ok to go ahead and schedule if not already done.

## 2018-03-07 NOTE — Telephone Encounter (Signed)
Notes recorded by Burnice Logan, RN on 03/07/2018 at 11:16 AM EDT Left message to call Sharee Pimple at [phone].

## 2018-03-10 ENCOUNTER — Other Ambulatory Visit: Payer: Self-pay | Admitting: Endocrinology

## 2018-03-10 DIAGNOSIS — E049 Nontoxic goiter, unspecified: Secondary | ICD-10-CM

## 2018-03-22 ENCOUNTER — Ambulatory Visit
Admission: RE | Admit: 2018-03-22 | Discharge: 2018-03-22 | Disposition: A | Discharge status: Home / Self Care | Payer: 59 | Source: Ambulatory Visit | Attending: Endocrinology | Admitting: Endocrinology

## 2018-03-22 DIAGNOSIS — E049 Nontoxic goiter, unspecified: Secondary | ICD-10-CM

## 2018-06-22 DIAGNOSIS — J101 Influenza due to other identified influenza virus with other respiratory manifestations: Secondary | ICD-10-CM | POA: Insufficient documentation

## 2018-08-01 ENCOUNTER — Ambulatory Visit
Admission: RE | Admit: 2018-08-01 | Discharge: 2018-08-01 | Disposition: A | Discharge status: Home / Self Care | Payer: 59 | Source: Ambulatory Visit | Attending: Obstetrics & Gynecology | Admitting: Obstetrics & Gynecology

## 2018-08-01 DIAGNOSIS — N6489 Other specified disorders of breast: Secondary | ICD-10-CM

## 2018-09-01 ENCOUNTER — Other Ambulatory Visit: Payer: Self-pay

## 2018-09-01 ENCOUNTER — Ambulatory Visit: Payer: 59 | Admitting: Obstetrics & Gynecology

## 2018-09-01 ENCOUNTER — Ambulatory Visit (INDEPENDENT_AMBULATORY_CARE_PROVIDER_SITE_OTHER): Payer: 59

## 2018-09-01 VITALS — BP 130/80 | HR 64 | Resp 14 | Ht 64.0 in | Wt 203.0 lb

## 2018-09-01 DIAGNOSIS — N9489 Other specified conditions associated with female genital organs and menstrual cycle: Secondary | ICD-10-CM

## 2018-09-01 NOTE — Progress Notes (Signed)
58 y.o. G0P0000 Married White or Caucasian female here for pelvic ultrasound due to complex adnexal masses that were initially noted on 10/28/2017.  She did see Dr. Everitt Amber in consultation who has a low suspicion for malignancy.  She recommended follow up ultrasound in 3 months and then every 6 months to complete 2 full years.    Denies any new issues/concerns.  Does not desire surgery if possible.  Patient's last menstrual period was 10/06/2013 (exact date).  Contraception: PMP  Findings:  UTERUS: 6.2 x 4.7 x 2.5cm with echogenic mass in cervical canal that is 7 x 4 mm mass consistent with cervical polyp (has been seen before on ultrasound) EMS: 2.4mm ADNEXA: Left ovary:  3.8 x 3.2 x 2.9cm with 2.0 x 1.1 x 1.3cm cyst and 1.4 x 1.3cm cyst       Right ovary: 3.3 x 2.3 x 2.3cm with 1.9 x 1.8 x 1.9cm cyst, 1.2 x 1.0 x 1.2cm cyst and 1.1 x 0.8cm cyst.  No significant change on either ovary noted CUL DE SAC: no free fluid  Discussion:  Findings reviewed.  With stable ultrasound findings, she needs to have Ca-125 obtained today.  If this is normal, she will repeat her PUS in 6 months.  Pt continues to not desire surgery.  Assessment:  Complex adnexal masses  Plan:  Cas-125 will be obtained today  Return for PUS 6 months  ~15 minutes spent with patient >50% of time was in face to face discussion of above.

## 2018-09-02 LAB — CA 125: Cancer Antigen (CA) 125: 11.7 U/mL (ref 0.0–38.1)

## 2018-09-04 ENCOUNTER — Encounter: Payer: Self-pay | Admitting: Obstetrics & Gynecology

## 2018-10-21 ENCOUNTER — Ambulatory Visit: Payer: 59 | Admitting: Certified Nurse Midwife

## 2018-11-21 ENCOUNTER — Other Ambulatory Visit: Payer: Self-pay | Admitting: Obstetrics & Gynecology

## 2018-11-21 DIAGNOSIS — Z1231 Encounter for screening mammogram for malignant neoplasm of breast: Secondary | ICD-10-CM

## 2019-01-17 ENCOUNTER — Other Ambulatory Visit: Payer: Self-pay

## 2019-01-17 ENCOUNTER — Encounter: Payer: Self-pay | Admitting: Certified Nurse Midwife

## 2019-01-17 ENCOUNTER — Ambulatory Visit: Payer: 59 | Admitting: Certified Nurse Midwife

## 2019-01-17 VITALS — BP 114/70 | HR 68 | Temp 97.2°F | Resp 16 | Ht 63.5 in | Wt 207.0 lb

## 2019-01-17 DIAGNOSIS — Z01419 Encounter for gynecological examination (general) (routine) without abnormal findings: Secondary | ICD-10-CM

## 2019-01-17 DIAGNOSIS — Z8679 Personal history of other diseases of the circulatory system: Secondary | ICD-10-CM | POA: Diagnosis not present

## 2019-01-17 DIAGNOSIS — N951 Menopausal and female climacteric states: Secondary | ICD-10-CM | POA: Diagnosis not present

## 2019-01-17 NOTE — Progress Notes (Signed)
58 y.o. G0P0000 Married  Caucasian Fe here for annual exam. Menopausal no HRT, denies vaginal bleeding or vaginal dryness. No health changes in last year. Last PUS and Ca 125 were normal. She will be due another in September 2020. Sees MD for hypertension/hypothyroid management and screening labs.   Patient's last menstrual period was 10/06/2013 (exact date).          Sexually active: Yes.    The current method of family planning is post menopausal status.    Exercising: No.  exercise Smoker:  no  Review of Systems  Constitutional: Negative.   HENT: Negative.   Eyes: Negative.   Respiratory: Negative.   Cardiovascular: Negative.   Gastrointestinal: Negative.   Genitourinary: Negative.   Musculoskeletal: Negative.   Skin: Negative.   Neurological: Negative.   Endo/Heme/Allergies: Negative.   Psychiatric/Behavioral: Negative.     Health Maintenance: Pap:  10-15-17 neg HPV HR neg History of Abnormal Pap: yes MMG:  01-18-18 bilateral, left diagnostic mmg 07/2018 category b density birads 1:neg Self Breast exams: yes Colonoscopy:  2017 f/u 44yrs BMD:   none TDaP:  2016 Shingles: no Pneumonia: no Hep C and HIV: both neg 2017 Labs: no    reports that she has never smoked. She has never used smokeless tobacco. She reports that she does not drink alcohol or use drugs.  Past Medical History:  Diagnosis Date  . Abnormal Pap smear of cervix   . Hypertension 2011 ?  Marland Kitchen Infertility, female   . Menstrual migraine   . Recurrent UTI   . Thyroid disease 2008    Past Surgical History:  Procedure Laterality Date  . CHOLECYSTECTOMY, LAPAROSCOPIC  10/07  . COLPOSCOPY    . WISDOM TOOTH EXTRACTION  age 42    Current Outpatient Medications  Medication Sig Dispense Refill  . Calcium Carbonate-Vitamin D (CALCIUM-VITAMIN D) 500-200 MG-UNIT per tablet Take 2 tablets by mouth daily.     . Cholecalciferol (VITAMIN D PO) Take 1,000 Int'l Units by mouth daily.     Marland Kitchen CRANBERRY PO Take by mouth  daily.     . Digestive Aids Mixture (PROTEOLYTIC ENZYMES PO) Take 3 tablets by mouth daily.    Marland Kitchen levothyroxine (SYNTHROID, LEVOTHROID) 88 MCG tablet Take 88 mcg by mouth daily before breakfast.    . losartan (COZAAR) 50 MG tablet Take 1 tablet by mouth daily.    . Multiple Vitamin (MULTIVITAMIN) tablet Take 1 tablet by mouth daily.    . Probiotic Product (PROBIOTIC PO) Take by mouth daily.     Marland Kitchen sulfamethoxazole-trimethoprim (BACTRIM DS,SEPTRA DS) 800-160 MG tablet Take Post coital prn 30 tablet 12   No current facility-administered medications for this visit.     Family History  Problem Relation Age of Onset  . Stroke Mother        mini stroke  . Heart disease Mother   . Alzheimer's disease Mother   . Diabetes Father   . Heart disease Father        CHF  . Kidney failure Father   . Heart disease Maternal Grandfather   . COPD Maternal Grandmother     ROS:  Pertinent items are noted in HPI.  Otherwise, a comprehensive ROS was negative.  Exam:   BP 114/70   Pulse 68   Temp (!) 97.2 F (36.2 C) (Skin)   Resp 16   Ht 5' 3.5" (1.613 m)   Wt 207 lb (93.9 kg)   LMP 10/06/2013 (Exact Date) Comment: spotting 2/19  BMI  36.09 kg/m  Height: 5' 3.5" (161.3 cm) Ht Readings from Last 3 Encounters:  01/17/19 5' 3.5" (1.613 m)  09/01/18 5\' 4"  (1.626 m)  03/03/18 5\' 4"  (1.626 m)    General appearance: alert, cooperative and appears stated age Head: Normocephalic, without obvious abnormality, atraumatic Neck: no adenopathy, supple, symmetrical, trachea midline and thyroid normal to inspection and palpation Lungs: clear to auscultation bilaterally Breasts: normal appearance, no masses or tenderness, No nipple retraction or dimpling, No nipple discharge or bleeding, No axillary or supraclavicular adenopathy Heart: regular rate and rhythm Abdomen: soft, non-tender; no masses,  no organomegaly Extremities: extremities normal, atraumatic, no cyanosis or edema Skin: Skin color, texture,  turgor normal. No rashes or lesions Lymph nodes: Cervical, supraclavicular, and axillary nodes normal. No abnormal inguinal nodes palpated Neurologic: Grossly normal   Pelvic: External genitalia:  no lesions              Urethra:  normal appearing urethra with no masses, tenderness or lesions              Bartholin's and Skene's: normal                 Vagina: normal appearing vagina with normal color and discharge, no lesions              Cervix: no cervical motion tenderness and no lesions, nulliparous              Pap taken: No. Bimanual Exam:  Uterus:  normal size, contour, position, consistency, mobility, non-tender and mid position              Adnexa: normal adnexa and no mass, fullness, tenderness               Rectovaginal: Confirms               Anus:  normal sphincter tone, no lesions  Chaperone present: yes  A:  Well Woman with normal exam  Menopausal no HRT  Follow up of complex adnexal masses due again in 03/04/2019 with CA 125  Hypothyroid, hypertension with endocrine management  Vitamin D deficiency      P:   Reviewed health and wellness pertinent to exam  Aware of need to advise if vaginal bleeding.  Will put in request for PUS and Ca 125, Vitamin D follow up and she will be called with appointment information.   Continue follow up with Endocrine as indicated.  Pap smear: no   counseled on breast self exam, mammography screening, feminine hygiene, menopause, adequate intake of calcium and vitamin D, diet and exercise  return annually or prn  An After Visit Summary was printed and given to the patient.

## 2019-01-18 ENCOUNTER — Telehealth: Payer: Self-pay | Admitting: *Deleted

## 2019-01-18 NOTE — Telephone Encounter (Signed)
Spoke with patient. PUS scheduled for 03/02/19 at 4pm, consult at 4:30pm with Dr. Sabra Heck. Will have labs drawn same day. Order previously placed for precert. Patient verbalizes understanding and is agreeable.   Routing to provider for final review. Patient is agreeable to disposition. Will close encounter.  Cc: Dr. Sabra Heck, Lerry Liner, [place]

## 2019-01-18 NOTE — Telephone Encounter (Signed)
-----   Message from Regina Eck, CNM sent at 01/17/2019  4:16 PM EDT ----- Patient needs PUS and lab CA 125 draw for follow up of adnexal masses follow up . PUS should be with Dr. Tressia Miners also needed Vitamin D with aex, but deferred to have drawn at the follow up. Her next 6 month follow up

## 2019-01-18 NOTE — Telephone Encounter (Signed)
-----   Message from Regina Eck, CNM sent at 01/17/2019  4:19 PM EDT ----- Patient needs follow up PUS around 03/04/2019 with CA 125 drawn at the same time for adnexal masses. Vitamin D declined today to be drawn at same time. Should be scheduled with Dr. Sabra Heck. She is aware she will be called with information regarding PUS.

## 2019-01-23 ENCOUNTER — Other Ambulatory Visit: Payer: Self-pay

## 2019-01-23 ENCOUNTER — Ambulatory Visit
Admission: RE | Admit: 2019-01-23 | Discharge: 2019-01-23 | Disposition: A | Discharge status: Home / Self Care | Payer: 59 | Source: Ambulatory Visit | Attending: Obstetrics & Gynecology | Admitting: Obstetrics & Gynecology

## 2019-01-23 DIAGNOSIS — Z1231 Encounter for screening mammogram for malignant neoplasm of breast: Secondary | ICD-10-CM

## 2019-02-27 ENCOUNTER — Telehealth: Payer: Self-pay | Admitting: Obstetrics & Gynecology

## 2019-02-27 NOTE — Telephone Encounter (Signed)
Call placed to patient to convey benefit information, with new [employer] plan, for scheduled ultrasound appointment. Left voicemail message requesting a return call

## 2019-02-28 ENCOUNTER — Other Ambulatory Visit: Payer: Self-pay

## 2019-03-02 ENCOUNTER — Other Ambulatory Visit: Payer: Self-pay

## 2019-03-02 ENCOUNTER — Ambulatory Visit: Payer: BC Managed Care – PPO | Admitting: Obstetrics & Gynecology

## 2019-03-02 ENCOUNTER — Ambulatory Visit (INDEPENDENT_AMBULATORY_CARE_PROVIDER_SITE_OTHER): Payer: BC Managed Care – PPO

## 2019-03-02 VITALS — BP 120/68 | HR 68 | Temp 97.4°F | Ht 63.5 in | Wt 206.0 lb

## 2019-03-02 DIAGNOSIS — N9489 Other specified conditions associated with female genital organs and menstrual cycle: Secondary | ICD-10-CM

## 2019-03-02 DIAGNOSIS — E559 Vitamin D deficiency, unspecified: Secondary | ICD-10-CM

## 2019-03-03 LAB — CA 125: Cancer Antigen (CA) 125: 12.6 U/mL (ref 0.0–38.1)

## 2019-03-03 LAB — VITAMIN D 25 HYDROXY (VIT D DEFICIENCY, FRACTURES): Vit D, 25-Hydroxy: 43.4 ng/mL (ref 30.0–100.0)

## 2019-03-05 ENCOUNTER — Encounter: Payer: Self-pay | Admitting: Obstetrics & Gynecology

## 2019-03-05 NOTE — Progress Notes (Signed)
58 y.o. G0P0000 Married White or Caucasian female here for pelvic ultrasound due to probable bilateral endometriomas that were noted initially in Feb, 2019 due to PMP bleeding.  The adnexal mass findings were not expected.  Ca-125 was normal.  She was sent to Dr. Denman George for consultation who felt findings were benign.  Consultation was on 01/03/2018.  Repeat PUS in 3 months recommended followed by every six months ultrasounds for two full years if stable.  She did have follow up ultrasound on 03/03/18 and then 09/01/2018.  Patient's last menstrual period was 10/06/2013 (exact date).  Contraception: PMP  Findings:  UTERUS: 8.5 x 4.3 x 2.8cm EMS: 2.24mm ADNEXA: Left ovary: 3.4 x 2.4 x 2.7cm with three cystic areas within ovary measuring 2 x 1.3cm, 1.3 x 1.0cm and 0.7 x 0.9cm, decreased in size, avascular       Right ovary: 3.5 x 2.0 x 2.4cm with two cystic area 1.5 x 1.6cm and 1.4 x 0.8, internal echoes present, avascular also decreased in size CUL DE SAC: no free fluid present  Discussion:  Findings on ovaries are similar to prior ultrasounds but the overall size of the ovarian cystic areas are all smaller.  Again, they are all avascular.  Ca-125 will be obtained today.    Pt also needs Vit D level checked today and that will be completed as well.  Assessment:  Bilateral adnexal masses  Plan:  Ca 125 today.  If stable repeat PUS in 6 months. Vit D will also be obtained today.  ~15 minutes spent with patient >50% of time was in face to face discussion of above.

## 2019-03-08 NOTE — Telephone Encounter (Signed)
Ultrasound completed on 03/02/2019. Will close encounter

## 2019-04-07 DIAGNOSIS — E039 Hypothyroidism, unspecified: Secondary | ICD-10-CM | POA: Diagnosis not present

## 2019-04-07 DIAGNOSIS — I10 Essential (primary) hypertension: Secondary | ICD-10-CM | POA: Diagnosis not present

## 2019-04-07 DIAGNOSIS — R7301 Impaired fasting glucose: Secondary | ICD-10-CM | POA: Diagnosis not present

## 2019-04-14 DIAGNOSIS — I10 Essential (primary) hypertension: Secondary | ICD-10-CM | POA: Diagnosis not present

## 2019-04-14 DIAGNOSIS — E049 Nontoxic goiter, unspecified: Secondary | ICD-10-CM | POA: Diagnosis not present

## 2019-04-14 DIAGNOSIS — E039 Hypothyroidism, unspecified: Secondary | ICD-10-CM | POA: Diagnosis not present

## 2019-04-14 DIAGNOSIS — R7301 Impaired fasting glucose: Secondary | ICD-10-CM | POA: Diagnosis not present

## 2019-04-19 NOTE — Addendum Note (Signed)
Encounter addended by: Eduard Clos R on: 04/19/2019 1:24 PM  Actions taken: Imaging Exam ended

## 2019-04-19 NOTE — Addendum Note (Signed)
Encounter addended by: Eduard Clos R on: 04/19/2019 1:24 PM  Actions taken: Imaging Exam begun

## 2019-06-15 ENCOUNTER — Encounter: Payer: Self-pay | Admitting: Obstetrics & Gynecology

## 2019-06-19 DIAGNOSIS — R5383 Other fatigue: Secondary | ICD-10-CM | POA: Diagnosis not present

## 2019-06-19 DIAGNOSIS — Z20822 Contact with and (suspected) exposure to covid-19: Secondary | ICD-10-CM | POA: Diagnosis not present

## 2019-06-19 DIAGNOSIS — R0981 Nasal congestion: Secondary | ICD-10-CM | POA: Diagnosis not present

## 2019-06-19 DIAGNOSIS — R05 Cough: Secondary | ICD-10-CM | POA: Diagnosis not present

## 2019-07-13 ENCOUNTER — Other Ambulatory Visit: Payer: Self-pay | Admitting: Certified Nurse Midwife

## 2019-07-13 NOTE — Telephone Encounter (Signed)
Medication refill request: Septra DS #[id number], R12 Last AEX:  01-17-19 Next AEX: 01-22-20 Last MMG (if hormonal medication request): n/a Refill authorized: refill if appropriate

## 2019-08-31 ENCOUNTER — Encounter: Payer: Self-pay | Admitting: Obstetrics & Gynecology

## 2019-08-31 ENCOUNTER — Ambulatory Visit: Payer: BC Managed Care – PPO | Admitting: Obstetrics & Gynecology

## 2019-08-31 ENCOUNTER — Ambulatory Visit (INDEPENDENT_AMBULATORY_CARE_PROVIDER_SITE_OTHER): Payer: BC Managed Care – PPO

## 2019-08-31 ENCOUNTER — Other Ambulatory Visit: Payer: Self-pay

## 2019-08-31 VITALS — BP 148/76 | HR 68 | Temp 97.5°F | Resp 10 | Ht 64.0 in | Wt 209.4 lb

## 2019-08-31 DIAGNOSIS — N809 Endometriosis, unspecified: Secondary | ICD-10-CM

## 2019-08-31 DIAGNOSIS — N9489 Other specified conditions associated with female genital organs and menstrual cycle: Secondary | ICD-10-CM

## 2019-08-31 DIAGNOSIS — N80129 Deep endometriosis of ovary, unspecified ovary: Secondary | ICD-10-CM

## 2019-08-31 NOTE — Progress Notes (Deleted)
59 y.o. G0P0000 Married White or Caucasian female here for pelvic ultrasound due to ***.  Patient's last menstrual period was 10/06/2013 (exact date).  Contraception: ***  Findings:  UTERUS: *** EMS:*** ADNEXA: Left ovary: ***       Right ovary: *** CUL DE SAC: ***  Discussion:  ***.  Assessment:  ***  Plan:  ***  ~{NUMBERS; -10-45 JOINT ROM:10287} minutes spent with patient >50% of time was in face to face discussion of above.

## 2019-08-31 NOTE — Progress Notes (Signed)
59 y.o. G0P0000 Married White or Caucasian female here for pelvic ultrasound due to h/o probable bilateral endometriomas noted initially on ultrasound 07/2017.  Evaluation was done due to PMP bleeding.   Adnexal masses were incidental finding.  Ca-125 was normal.  Pt was referred to Dr. Denman George who recommended repeat PUS in 3 months and then every 6 months for two full years.  Ca-125 also recommended.  If stable for two years, evaluation did not need to be continued.    Denies pelvic pain or vaginal bleeding.  Patient's last menstrual period was 10/06/2013 (exact date).  Contraception: PMP  Findings:  UTERUS: 8.5 x 3.2 x 2.2cm EMS: 2.36mm  ADNEXA: Left ovary: 3.9 x 3.1 x 2.3cm with three cysts measuring 15, 14 and 52mm with internal echoes c/w endometrioma.  Avascular.  No significant growth Right ovary: 3.0 x 1.5 x 1.6cm with 1.5 and 1.4cm cysts with low level echoes c/w endometriomas (stable), avascular.  No significant change in size CUL DE SAC: no free fluid  Discussion:  Ultrasonographer supervised.  Results reviewed.  Pt aware this is stable.  Ca-125 needs to be obtained today.  Reviewed recommendations from Dr. Denman George.  If ultrasound in 6 months is stable, will not continue follow up as it will be two full years of stable findings.  Pt knows repeat imaging would be done with any new symptoms after that point.  Assessment:  Bilateral complex adnexal masses in PMP female, stable   Plan:  Repeat PUS in 6 months will be scheduled.  Order placed. Ca-125 obtained today

## 2019-09-01 LAB — CA 125: Cancer Antigen (CA) 125: 12.7 U/mL (ref 0.0–38.1)

## 2019-09-04 ENCOUNTER — Encounter: Payer: Self-pay | Admitting: Certified Nurse Midwife

## 2019-09-11 DIAGNOSIS — M545 Low back pain: Secondary | ICD-10-CM | POA: Diagnosis not present

## 2019-09-11 DIAGNOSIS — H93A3 Pulsatile tinnitus, bilateral: Secondary | ICD-10-CM | POA: Diagnosis not present

## 2019-09-11 DIAGNOSIS — H6593 Unspecified nonsuppurative otitis media, bilateral: Secondary | ICD-10-CM | POA: Diagnosis not present

## 2019-09-11 DIAGNOSIS — L84 Corns and callosities: Secondary | ICD-10-CM | POA: Diagnosis not present

## 2019-09-13 DIAGNOSIS — M533 Sacrococcygeal disorders, not elsewhere classified: Secondary | ICD-10-CM | POA: Diagnosis not present

## 2019-09-13 DIAGNOSIS — M545 Low back pain: Secondary | ICD-10-CM | POA: Diagnosis not present

## 2019-09-18 DIAGNOSIS — H93A3 Pulsatile tinnitus, bilateral: Secondary | ICD-10-CM | POA: Diagnosis not present

## 2019-09-18 DIAGNOSIS — H6593 Unspecified nonsuppurative otitis media, bilateral: Secondary | ICD-10-CM | POA: Diagnosis not present

## 2019-09-18 DIAGNOSIS — H938X3 Other specified disorders of ear, bilateral: Secondary | ICD-10-CM | POA: Diagnosis not present

## 2020-01-09 ENCOUNTER — Other Ambulatory Visit: Payer: Self-pay | Admitting: Obstetrics & Gynecology

## 2020-01-09 DIAGNOSIS — Z1231 Encounter for screening mammogram for malignant neoplasm of breast: Secondary | ICD-10-CM

## 2020-01-22 ENCOUNTER — Ambulatory Visit: Payer: 59 | Admitting: Certified Nurse Midwife

## 2020-01-23 ENCOUNTER — Telehealth: Payer: Self-pay | Admitting: Obstetrics & Gynecology

## 2020-01-23 NOTE — Telephone Encounter (Signed)
Call to patient. Per DPR, OK to leave message on voicemail.  Left voicemail requesting a return call to [facility] to review benefits and schedule recommended Pelvic ultrasound with M. Suzanne Miller, MD 

## 2020-01-24 ENCOUNTER — Ambulatory Visit
Admission: RE | Admit: 2020-01-24 | Discharge: 2020-01-24 | Disposition: A | Discharge status: Home / Self Care | Payer: BC Managed Care – PPO | Source: Ambulatory Visit | Attending: Obstetrics & Gynecology | Admitting: Obstetrics & Gynecology

## 2020-01-24 ENCOUNTER — Other Ambulatory Visit: Payer: Self-pay

## 2020-01-24 DIAGNOSIS — Z1231 Encounter for screening mammogram for malignant neoplasm of breast: Secondary | ICD-10-CM | POA: Diagnosis not present

## 2020-01-24 NOTE — Telephone Encounter (Signed)
Spoke with patient regarding benefits for recommended ultrasound. Patient is aware that ultrasound is transvaginal. Patient acknowledges understanding of information presented. Patient is aware of cancellation policy. Patient scheduled appointment for 03/07/2020 at 0300PM with M. Edwinna Areola, MD. Encounter closed.

## 2020-01-29 ENCOUNTER — Ambulatory Visit: Payer: BC Managed Care – PPO | Admitting: Obstetrics and Gynecology

## 2020-01-30 NOTE — Progress Notes (Signed)
59 y.o. G0P0000 Married White or Caucasian female here for annual exam.  Doing well.  Denies vaginal bleeding.  Has hx of bilateral adnexal masses that looks like endometriomas.  She saw Dr. Denman George for evaluation.  Recommended follow up ultrasound in 3 and 6 months.  Follow up recommended every six months for 2 full years.    She had Covid in January.  She had a cough and she lost her sense of taste and smell.  She is planning on getting the antibody testing done at [facility].  She is not planning on getting vaccinated at this time.    Requests vit D being checked today.  PCP:  Dr. Bebe Shaggy Endocrinologist:  Dr. Chalmers Cater.  Blood work including cholesterol is done with her.     Patient's last menstrual period was 10/06/2013 (exact date).          Sexually active: Yes.    The current method of family planning is post menopausal status.    Exercising: Yes.    walking Smoker:  no  Health Maintenance: Pap:  10-15-17 neg HPV HR neg History of abnormal Pap:  yes MMG:  01-25-2020 category b density birads 1:neg Colonoscopy:  2017 f/u 41yrs BMD:   none TDaP:  2016 Pneumonia vaccine(s):  no Shingrix:   Hep C testing: neg 2017 Screening Labs: done with Dr. Chalmers Cater   reports that she has never smoked. She has never used smokeless tobacco. She reports that she does not drink alcohol and does not use drugs.  Past Medical History:  Diagnosis Date  . Abnormal Pap smear of cervix   . Blood type O-   . Hypertension 2011 ?  Marland Kitchen Infertility, female   . Menstrual migraine   . Recurrent UTI   . Thyroid disease 2008    Past Surgical History:  Procedure Laterality Date  . CHOLECYSTECTOMY, LAPAROSCOPIC  10/07  . COLPOSCOPY    . WISDOM TOOTH EXTRACTION  age 63    Current Outpatient Medications  Medication Sig Dispense Refill  . Calcium Carbonate-Vitamin D (CALCIUM-VITAMIN D) 500-200 MG-UNIT per tablet Take 2 tablets by mouth daily.     . Cholecalciferol (VITAMIN D PO) Take 1,000 Int'l Units by mouth daily.      Marland Kitchen CRANBERRY PO Take by mouth daily.     . Digestive Aids Mixture (PROTEOLYTIC ENZYMES PO) Take 3 tablets by mouth daily.    Marland Kitchen levothyroxine (SYNTHROID, LEVOTHROID) 88 MCG tablet Take 88 mcg by mouth daily before breakfast.    . losartan (COZAAR) 50 MG tablet Take 1 tablet by mouth daily.    . Multiple Vitamin (MULTIVITAMIN) tablet Take 1 tablet by mouth daily.    . Probiotic Product (PROBIOTIC PO) Take by mouth daily.     Marland Kitchen sulfamethoxazole-trimethoprim (BACTRIM DS) 800-160 MG tablet TAKE POST COITAL AS NEEDED 30 tablet 11   No current facility-administered medications for this visit.    Family History  Problem Relation Age of Onset  . Stroke Mother        mini stroke  . Heart disease Mother   . Alzheimer's disease Mother   . Diabetes Father   . Heart disease Father        CHF  . Kidney failure Father   . Heart disease Maternal Grandfather   . COPD Maternal Grandmother     Review of Systems  Constitutional: Negative.   HENT: Negative.   Eyes: Negative.   Respiratory: Negative.   Cardiovascular: Negative.   Gastrointestinal: Negative.   Endocrine:  Negative.   Genitourinary: Negative.   Musculoskeletal: Negative.   Skin: Negative.   Allergic/Immunologic: Negative.   Neurological: Negative.   Hematological: Negative.   Psychiatric/Behavioral: Negative.     Exam:   BP 116/68   Pulse 70   Resp 16   Ht 5' 3.5" (1.613 m)   Wt 209 lb (94.8 kg)   LMP 10/06/2013 (Exact Date) Comment: spotting 2/19  BMI 36.44 kg/m   Height: 5' 3.5" (161.3 cm)  General appearance: alert, cooperative and appears stated age Head: Normocephalic, without obvious abnormality, atraumatic Neck: no adenopathy, supple, symmetrical, trachea midline and thyroid normal to inspection and palpation Lungs: clear to auscultation bilaterally Breasts: normal appearance, no masses or tenderness Heart: regular rate and rhythm Abdomen: soft, non-tender; bowel sounds normal; no masses,  no  organomegaly Extremities: extremities normal, atraumatic, no cyanosis or edema Skin: Skin color, texture, turgor normal. No rashes or lesions Lymph nodes: Cervical, supraclavicular, and axillary nodes normal. No abnormal inguinal nodes palpated Neurologic: Grossly normal   Pelvic: External genitalia:  no lesions              Urethra:  normal appearing urethra with no masses, tenderness or lesions              Bartholins and Skenes: normal                 Vagina: normal appearing vagina with normal color and discharge, no lesions              Cervix: no lesions              Pap taken: No. Bimanual Exam:  Uterus:  normal size, contour, position, consistency, mobility, non-tender              Adnexa: normal adnexa and no mass, fullness, tenderness               Rectovaginal: Confirms               Anus:  normal sphincter tone, no lesions  Chaperone, Terence Lux, CMA, was present for exam.  A:  Well Woman with normal exam PMP, no HRT H/o complex adnexal masses diagnosed 12/2017 Hypothyroidism Hypertension H/o low Vit D   P:   Mammogram guidelines reviewed.  This is up to date.  pap smear with neg HR HPV 2019.  Not indicated today Colonoscopy up to date Guidelines for BMD discussed.  Plan to do this around age 34. Ca -125 and Vit D level obtained today Other blood work is done with Dr. Chalmers Cater She will return in about 1 month for PUS that is already scheduled. return annually or prn

## 2020-02-05 ENCOUNTER — Ambulatory Visit: Payer: BC Managed Care – PPO | Admitting: Obstetrics & Gynecology

## 2020-02-05 ENCOUNTER — Other Ambulatory Visit: Payer: Self-pay

## 2020-02-05 ENCOUNTER — Encounter: Payer: Self-pay | Admitting: Obstetrics & Gynecology

## 2020-02-05 VITALS — BP 116/68 | HR 70 | Resp 16 | Ht 63.5 in | Wt 209.0 lb

## 2020-02-05 DIAGNOSIS — N809 Endometriosis, unspecified: Secondary | ICD-10-CM | POA: Diagnosis not present

## 2020-02-05 DIAGNOSIS — N80129 Deep endometriosis of ovary, unspecified ovary: Secondary | ICD-10-CM

## 2020-02-05 DIAGNOSIS — Z01419 Encounter for gynecological examination (general) (routine) without abnormal findings: Secondary | ICD-10-CM | POA: Diagnosis not present

## 2020-02-05 DIAGNOSIS — Z0000 Encounter for general adult medical examination without abnormal findings: Secondary | ICD-10-CM

## 2020-02-06 LAB — VITAMIN D 25 HYDROXY (VIT D DEFICIENCY, FRACTURES): Vit D, 25-Hydroxy: 38 ng/mL (ref 30.0–100.0)

## 2020-02-06 LAB — CA 125: Cancer Antigen (CA) 125: 12.2 U/mL (ref 0.0–38.1)

## 2020-02-22 IMAGING — MG DIGITAL DIAGNOSTIC UNILATERAL LEFT MAMMOGRAM WITH TOMO AND CAD
6 of 13 series · 6 of 40 positions shown · non-contrast
Comparison: Previous exam(s).

ADDENDUM:
The patient returned on 01/27/2018 for stereotactic biopsy of
possible LEFT breast distortion.

Pre biopsy image and 2D/3D spot compression views of the LEFT breast
demonstrate no persistent distortion within the UPPER-OUTER LEFT
breast to target.
The biopsy was therefore canceled.
This was discussed with the patient.
RECOMMENDATION:
Follow-up LEFT diagnostic mammogram in 6 months.
BI-RADS CATEGORY: 3: Probably benign.
CLINICAL DATA: Screening recall for a left breast asymmetry.
EXAM:
DIGITAL DIAGNOSTIC LEFT MAMMOGRAM WITH CAD AND TOMO
ULTRASOUND LEFT BREAST

[L CC synth-2D (1 of 2)]
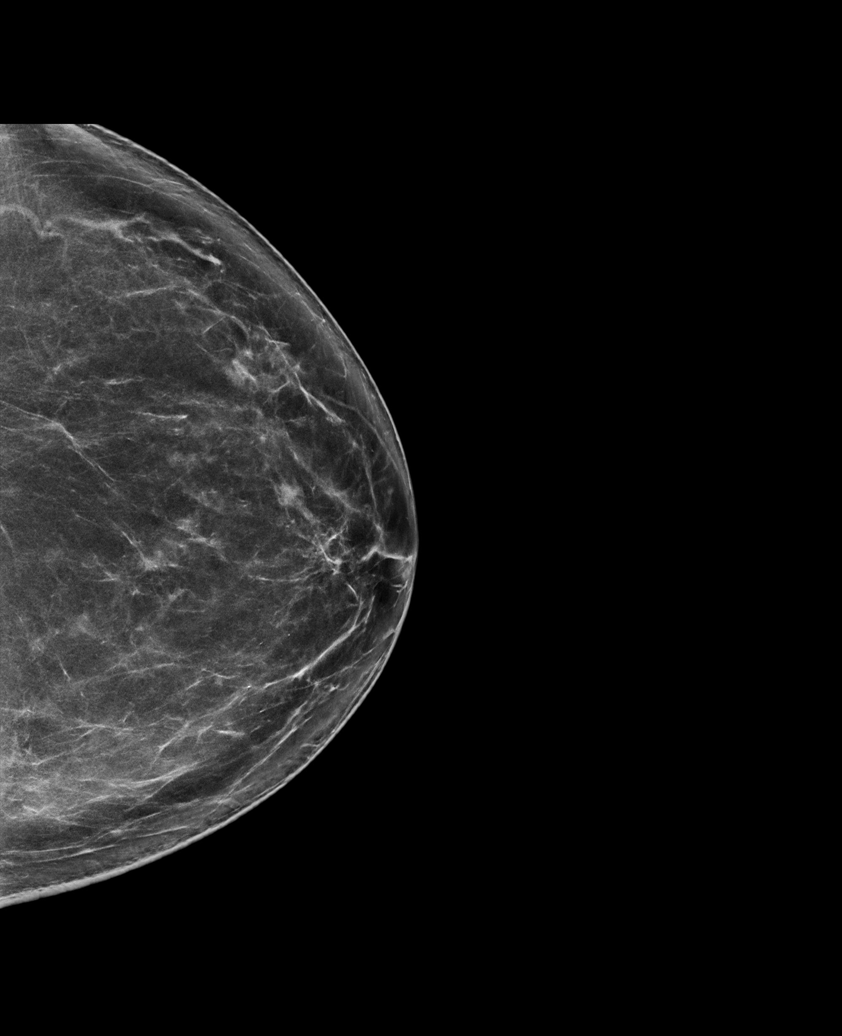

[L MLO synth-2D (1 of 3)]
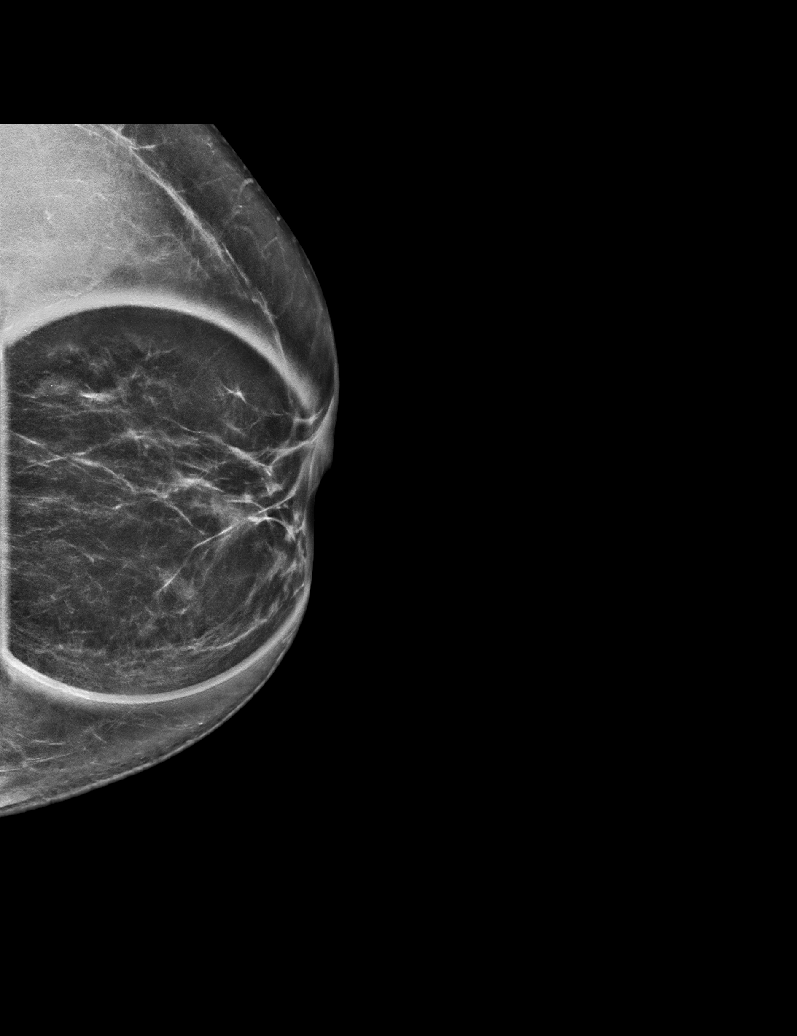

[L CC synth-2D (2 of 2)]
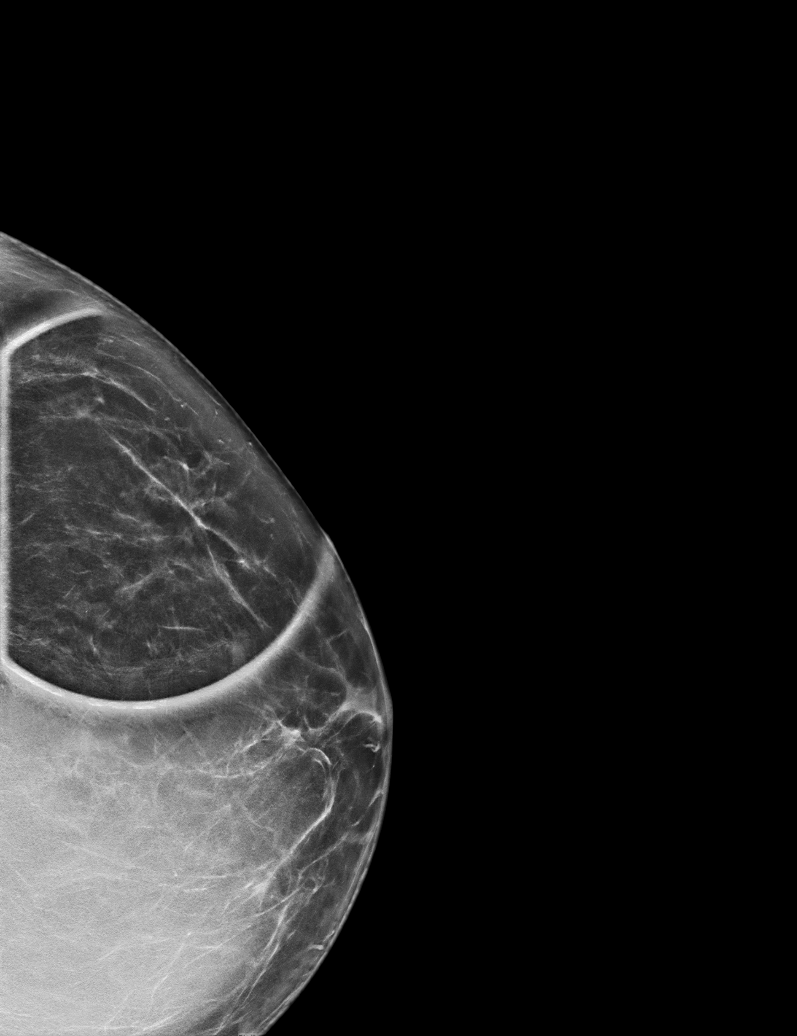

[L MLO synth-2D (2 of 3)]
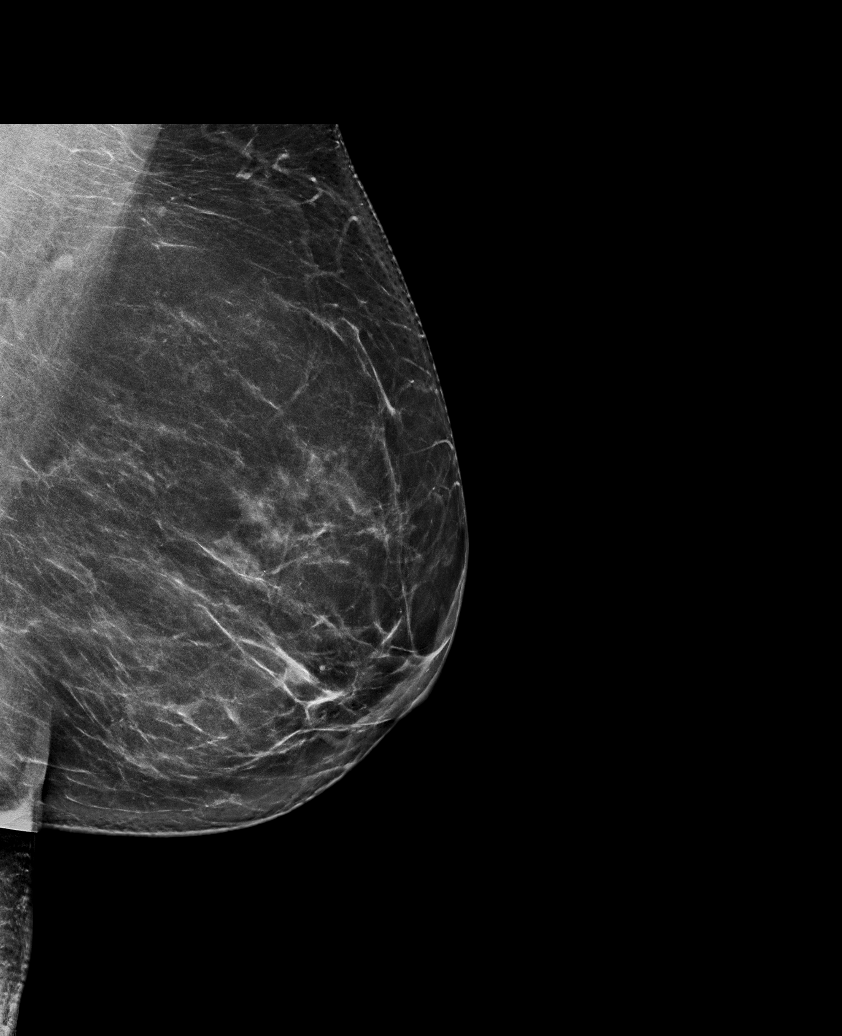

[L MLO synth-2D (3 of 3)]
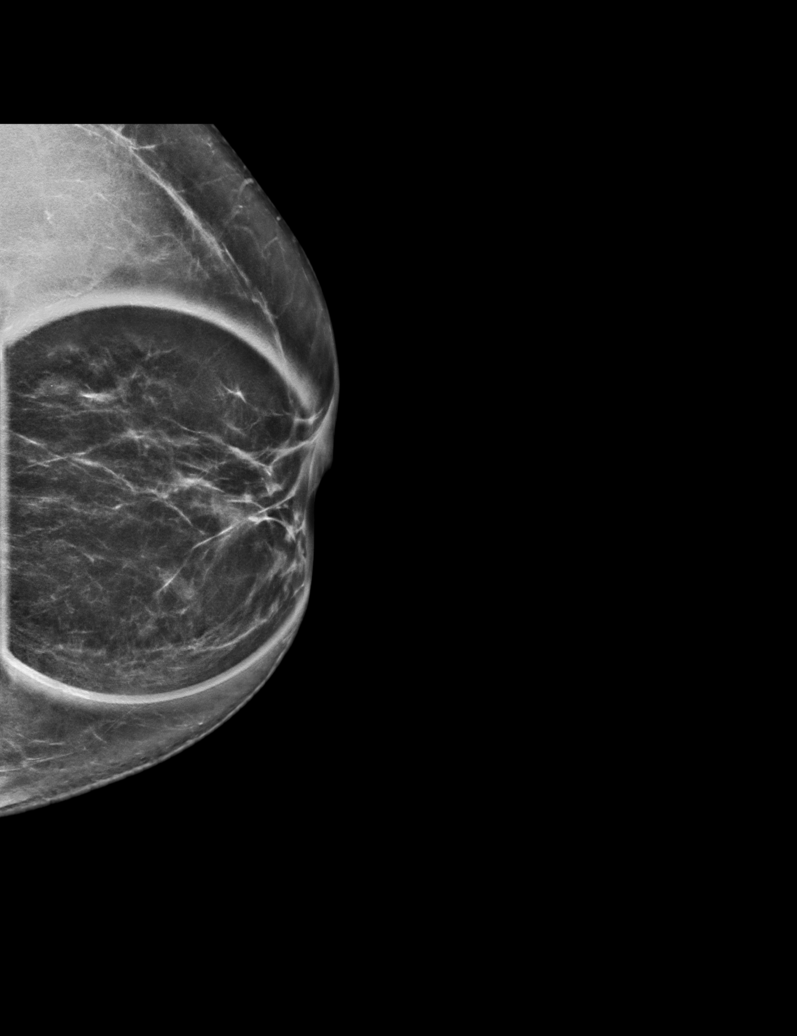

[L CC tomo · tomo slice 43/85.0]
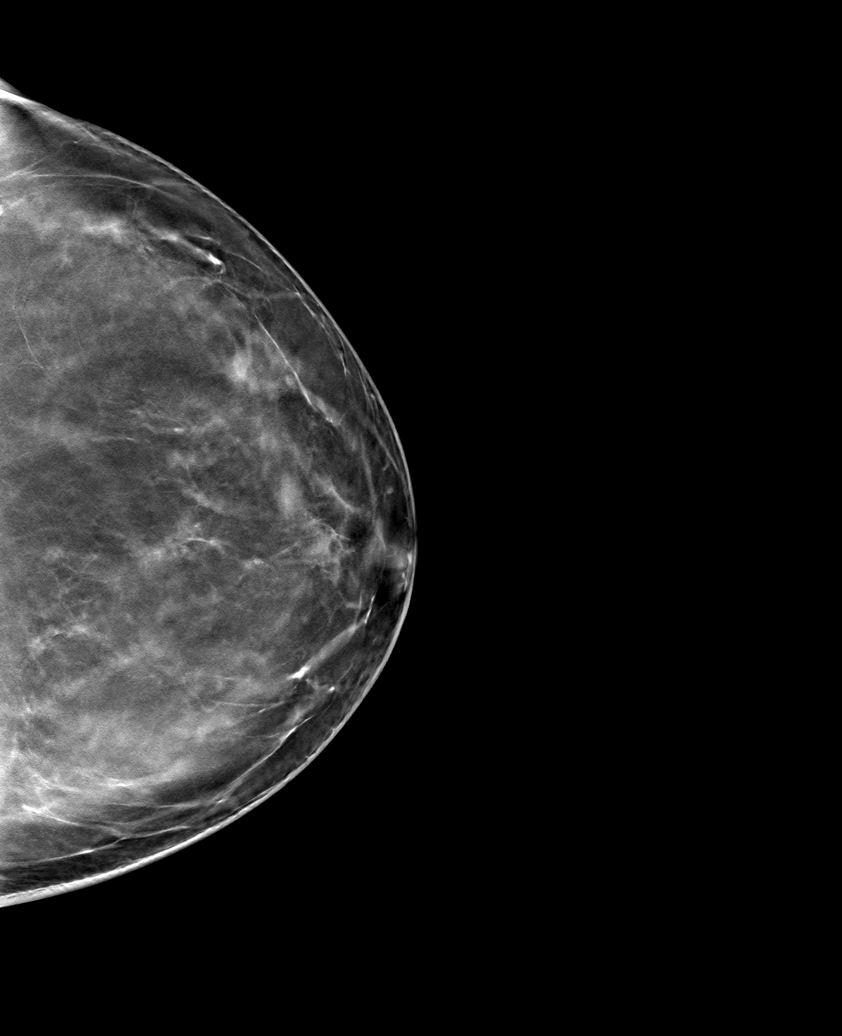

[6 of 40 positions shown; findings below may reference images not displayed]

ACR Breast Density Category b: There are scattered areas of
fibroglandular density.
FINDINGS: There is a persistent asymmetry in slightly upper outer quadrant of
the left breast. This has a subtle possible distorted appearance on
the tomosynthesis images. There are possibly 2 additional nodules in
the lower outer left breast which have been seen on prior
mammograms.

Mammographic images were processed with CAD.

On physical exam, no suspicious palpable masses identified in the
upper-outer quadrant of the left breast or in the lower outer left
breast.

Ultrasound of the upper-outer quadrant of the left breast
demonstrates no discrete suspicious masses or areas of shadowing. In
the lower outer left breast, there are 2 tiny areas of fibrocystic
change, compatible with the stable areas identified
mammographically. Ultrasound of the left axilla demonstrates
multiple normal-appearing lymph nodes.
IMPRESSION: There is an indeterminate possible area of distortion in the
upper-outer left breast without a sonographic correlate.

RECOMMENDATION:
Stereotactic biopsy is recommended for the left breast possible
distortion. This has been scheduled for 01/27/2018 at [DATE] a.m..

I have discussed the findings and recommendations with the patient.
Results were also provided in writing at the conclusion of the
visit. If applicable, a reminder letter will be sent to the patient
regarding the next appointment.

BI-RADS CATEGORY  4: Suspicious.

## 2020-02-29 ENCOUNTER — Telehealth: Payer: Self-pay

## 2020-02-29 NOTE — Telephone Encounter (Signed)
Call to patient. Per DPR, OK to leave message on voicemail.   Left voicemail requesting a return call to [facility] to reschedule recommended Pelvic ultrasound with M. Edwinna Areola, MD.   Patient's original appointment on 03/07/2020 was cancelled due to sonographer being out of the office.

## 2020-03-01 NOTE — Telephone Encounter (Signed)
Spoke with patient. Patient rescheduled appointment for 03/12/2020 at 0300PM with Dr. Sabra Heck. Encounter closed.

## 2020-03-05 DIAGNOSIS — E119 Type 2 diabetes mellitus without complications: Secondary | ICD-10-CM | POA: Diagnosis not present

## 2020-03-07 ENCOUNTER — Other Ambulatory Visit: Payer: BC Managed Care – PPO

## 2020-03-07 ENCOUNTER — Other Ambulatory Visit: Payer: BC Managed Care – PPO | Admitting: Obstetrics & Gynecology

## 2020-03-12 ENCOUNTER — Encounter: Payer: Self-pay | Admitting: Obstetrics & Gynecology

## 2020-03-12 ENCOUNTER — Other Ambulatory Visit: Payer: Self-pay

## 2020-03-12 ENCOUNTER — Ambulatory Visit (INDEPENDENT_AMBULATORY_CARE_PROVIDER_SITE_OTHER): Payer: BC Managed Care – PPO

## 2020-03-12 ENCOUNTER — Ambulatory Visit: Payer: BC Managed Care – PPO | Admitting: Obstetrics & Gynecology

## 2020-03-12 ENCOUNTER — Other Ambulatory Visit: Payer: BC Managed Care – PPO

## 2020-03-12 VITALS — BP 112/66 | HR 70 | Resp 16 | Wt 212.0 lb

## 2020-03-12 DIAGNOSIS — N9489 Other specified conditions associated with female genital organs and menstrual cycle: Secondary | ICD-10-CM | POA: Diagnosis not present

## 2020-03-12 DIAGNOSIS — N809 Endometriosis, unspecified: Secondary | ICD-10-CM

## 2020-03-12 DIAGNOSIS — N80129 Deep endometriosis of ovary, unspecified ovary: Secondary | ICD-10-CM

## 2020-03-12 DIAGNOSIS — N841 Polyp of cervix uteri: Secondary | ICD-10-CM | POA: Diagnosis not present

## 2020-03-12 NOTE — Progress Notes (Signed)
59 y.o. G0P0000 Married White or Caucasian female here for pelvic ultrasound due to probable bilateral endometriomas.  Pt was seen by gyn oncology and is having follow up for 2 full years.  CA-125 obtained on 02/05/2020 was 12.2.  This has been stable.  This is the last ultrasound in that time frame.  Denies pelvic pain.  Feels well.  Patient's last menstrual period was 10/06/2013 (exact date).  Contraception: PMP  Findings:  UTERUS: 6.3 x 2.8 x 3.5 cm, small amount of fluid noted within the endometrial cavity with probable cervical polyp measuring 4 x 1 mm.  This was not noted on the previous scan but was seen on imaging in 2019.  3D measurement was made of this today.  In comparing the 2D images, this appears stable in size. EMS: 2.9 mm ADNEXA: Left ovary: 4.9 x 3.1 x 3.1 cm with cysts that contain debris measuring 24 x 18 mm, 19 x 11 mm, 13 x 11 mm.  This ovary measurements a little larger than the last one but that is possibly due to measuring possible small area of normal ovarian tissue on one side of the cysts.  The overall appearance of the ovary is stable and all the cysts are avascular       Right ovary: 3.3 x 1.9 x 1.8 cm with cyst containing debris measuring 16 x 15 mm, and 15 x 12 mm.  Overall appearance of this ovary is stable.  It is also avascular.  Discussion: Ultrasonographer supervised.  Images reviewed with patient.  Comparison to previous ultrasound made.  Patient is aware that there is a 4 x 1 mm cervical polyp is been present for 2 years and is unchanged in appearance.  Patient also discussed the recommendation of removal of endometrial polyps in postmenopausal women.  As this is so small and is asymptomatic and the patient does not want a proceed with any surgical procedures if at all possible, I think is fine to monitor for symptoms.  She is aware to let our office know if she has any postcoital bleeding and or vaginal spotting/bleeding.  She is where the most common signs of  polyps are bleeding..  Assessment: Bilateral complex ovarian masses that are most consistent with endometriomas 4 x 1 mm endocervical polyp  Plan: Stable ultrasound findings have been present for 2 years.  GYN consult has been obtained.  2-year follow-up recommended and this is completed today.  Normal CA-125 obtained in August. For now, will monitor for any symptoms related to the polyp.  Patient does not want to proceed with any procedures at this time.  ~25 minutes spent with patient reviewing current images, comparing to previous images, discussing completion of follow-up, discussing presence of endocervical polyp including future signs or symptoms to watch for, and completion of documentation

## 2020-03-14 DIAGNOSIS — N841 Polyp of cervix uteri: Secondary | ICD-10-CM | POA: Insufficient documentation

## 2020-03-28 ENCOUNTER — Other Ambulatory Visit: Payer: Self-pay | Admitting: Endocrinology

## 2020-03-28 DIAGNOSIS — E049 Nontoxic goiter, unspecified: Secondary | ICD-10-CM

## 2020-04-03 DIAGNOSIS — I10 Essential (primary) hypertension: Secondary | ICD-10-CM | POA: Diagnosis not present

## 2020-04-03 DIAGNOSIS — R7301 Impaired fasting glucose: Secondary | ICD-10-CM | POA: Diagnosis not present

## 2020-04-03 DIAGNOSIS — E039 Hypothyroidism, unspecified: Secondary | ICD-10-CM | POA: Diagnosis not present

## 2020-04-11 DIAGNOSIS — R7301 Impaired fasting glucose: Secondary | ICD-10-CM | POA: Diagnosis not present

## 2020-04-11 DIAGNOSIS — I10 Essential (primary) hypertension: Secondary | ICD-10-CM | POA: Diagnosis not present

## 2020-04-11 DIAGNOSIS — E039 Hypothyroidism, unspecified: Secondary | ICD-10-CM | POA: Diagnosis not present

## 2020-04-11 DIAGNOSIS — E049 Nontoxic goiter, unspecified: Secondary | ICD-10-CM | POA: Diagnosis not present

## 2020-04-17 ENCOUNTER — Ambulatory Visit
Admission: RE | Admit: 2020-04-17 | Discharge: 2020-04-17 | Disposition: A | Discharge status: Home / Self Care | Payer: BC Managed Care – PPO | Source: Ambulatory Visit | Attending: Endocrinology | Admitting: Endocrinology

## 2020-04-17 DIAGNOSIS — E049 Nontoxic goiter, unspecified: Secondary | ICD-10-CM

## 2020-04-17 DIAGNOSIS — E041 Nontoxic single thyroid nodule: Secondary | ICD-10-CM | POA: Diagnosis not present

## 2020-06-19 DIAGNOSIS — G5762 Lesion of plantar nerve, left lower limb: Secondary | ICD-10-CM | POA: Diagnosis not present

## 2020-06-19 DIAGNOSIS — M7662 Achilles tendinitis, left leg: Secondary | ICD-10-CM | POA: Diagnosis not present

## 2020-07-24 DIAGNOSIS — M7742 Metatarsalgia, left foot: Secondary | ICD-10-CM | POA: Diagnosis not present

## 2020-08-21 DIAGNOSIS — M7742 Metatarsalgia, left foot: Secondary | ICD-10-CM | POA: Diagnosis not present

## 2020-08-21 DIAGNOSIS — M79675 Pain in left toe(s): Secondary | ICD-10-CM | POA: Diagnosis not present

## 2020-11-27 ENCOUNTER — Other Ambulatory Visit: Payer: Self-pay | Admitting: Obstetrics & Gynecology

## 2020-11-27 DIAGNOSIS — Z1231 Encounter for screening mammogram for malignant neoplasm of breast: Secondary | ICD-10-CM

## 2020-12-17 DIAGNOSIS — M654 Radial styloid tenosynovitis [de Quervain]: Secondary | ICD-10-CM | POA: Diagnosis not present

## 2021-01-24 ENCOUNTER — Ambulatory Visit
Admission: RE | Admit: 2021-01-24 | Discharge: 2021-01-24 | Disposition: A | Discharge status: Home / Self Care | Payer: BC Managed Care – PPO | Source: Ambulatory Visit | Attending: Obstetrics & Gynecology | Admitting: Obstetrics & Gynecology

## 2021-01-24 ENCOUNTER — Other Ambulatory Visit: Payer: Self-pay

## 2021-01-24 DIAGNOSIS — Z1231 Encounter for screening mammogram for malignant neoplasm of breast: Secondary | ICD-10-CM

## 2021-01-29 ENCOUNTER — Other Ambulatory Visit: Payer: Self-pay | Admitting: Obstetrics & Gynecology

## 2021-01-29 DIAGNOSIS — R928 Other abnormal and inconclusive findings on diagnostic imaging of breast: Secondary | ICD-10-CM

## 2021-02-04 DIAGNOSIS — M654 Radial styloid tenosynovitis [de Quervain]: Secondary | ICD-10-CM | POA: Diagnosis not present

## 2021-02-13 ENCOUNTER — Other Ambulatory Visit: Payer: Self-pay

## 2021-02-13 ENCOUNTER — Ambulatory Visit
Admission: RE | Admit: 2021-02-13 | Discharge: 2021-02-13 | Disposition: A | Discharge status: Home / Self Care | Payer: BC Managed Care – PPO | Source: Ambulatory Visit | Attending: Obstetrics & Gynecology | Admitting: Obstetrics & Gynecology

## 2021-02-13 DIAGNOSIS — N6323 Unspecified lump in the left breast, lower outer quadrant: Secondary | ICD-10-CM | POA: Diagnosis not present

## 2021-02-13 DIAGNOSIS — R922 Inconclusive mammogram: Secondary | ICD-10-CM | POA: Diagnosis not present

## 2021-02-13 DIAGNOSIS — R928 Other abnormal and inconclusive findings on diagnostic imaging of breast: Secondary | ICD-10-CM

## 2021-02-13 DIAGNOSIS — N6489 Other specified disorders of breast: Secondary | ICD-10-CM | POA: Diagnosis not present

## 2021-02-13 DIAGNOSIS — N6002 Solitary cyst of left breast: Secondary | ICD-10-CM | POA: Diagnosis not present

## 2021-03-06 DIAGNOSIS — E119 Type 2 diabetes mellitus without complications: Secondary | ICD-10-CM | POA: Diagnosis not present

## 2021-03-11 DIAGNOSIS — M654 Radial styloid tenosynovitis [de Quervain]: Secondary | ICD-10-CM | POA: Diagnosis not present

## 2021-03-18 ENCOUNTER — Other Ambulatory Visit (HOSPITAL_BASED_OUTPATIENT_CLINIC_OR_DEPARTMENT_OTHER): Payer: Self-pay

## 2021-03-18 ENCOUNTER — Telehealth (HOSPITAL_BASED_OUTPATIENT_CLINIC_OR_DEPARTMENT_OTHER): Payer: Self-pay

## 2021-03-18 MED ORDER — SULFAMETHOXAZOLE-TRIMETHOPRIM 800-160 MG PO TABS
ORAL_TABLET | ORAL | 1 refills | Status: DC
Start: 2021-03-18 — End: 2022-08-11

## 2021-03-18 NOTE — Telephone Encounter (Signed)
Patient has called and wanted to know if she can get a refill on the bactrim Rx that was originally prescribed by Dr. Lianne Moris? Please advise. tbw

## 2021-03-25 NOTE — Telephone Encounter (Signed)
Entered in Error. tbw 

## 2021-03-27 ENCOUNTER — Ambulatory Visit (HOSPITAL_BASED_OUTPATIENT_CLINIC_OR_DEPARTMENT_OTHER): Payer: BC Managed Care – PPO | Admitting: Obstetrics & Gynecology

## 2021-04-11 DIAGNOSIS — E7800 Pure hypercholesterolemia, unspecified: Secondary | ICD-10-CM | POA: Diagnosis not present

## 2021-04-11 DIAGNOSIS — R7301 Impaired fasting glucose: Secondary | ICD-10-CM | POA: Diagnosis not present

## 2021-04-11 DIAGNOSIS — E039 Hypothyroidism, unspecified: Secondary | ICD-10-CM | POA: Diagnosis not present

## 2021-04-18 DIAGNOSIS — E049 Nontoxic goiter, unspecified: Secondary | ICD-10-CM | POA: Diagnosis not present

## 2021-04-18 DIAGNOSIS — E039 Hypothyroidism, unspecified: Secondary | ICD-10-CM | POA: Diagnosis not present

## 2021-04-18 DIAGNOSIS — R7301 Impaired fasting glucose: Secondary | ICD-10-CM | POA: Diagnosis not present

## 2021-04-18 DIAGNOSIS — I10 Essential (primary) hypertension: Secondary | ICD-10-CM | POA: Diagnosis not present

## 2021-05-22 ENCOUNTER — Encounter (HOSPITAL_BASED_OUTPATIENT_CLINIC_OR_DEPARTMENT_OTHER): Payer: Self-pay

## 2021-05-22 ENCOUNTER — Ambulatory Visit (HOSPITAL_BASED_OUTPATIENT_CLINIC_OR_DEPARTMENT_OTHER): Payer: BC Managed Care – PPO | Admitting: Obstetrics & Gynecology

## 2021-05-28 ENCOUNTER — Other Ambulatory Visit (HOSPITAL_COMMUNITY)
Admission: RE | Admit: 2021-05-28 | Discharge: 2021-05-28 | Disposition: A | Discharge status: Home / Self Care | Payer: BC Managed Care – PPO | Source: Ambulatory Visit | Attending: Obstetrics & Gynecology | Admitting: Obstetrics & Gynecology

## 2021-05-28 ENCOUNTER — Encounter (HOSPITAL_BASED_OUTPATIENT_CLINIC_OR_DEPARTMENT_OTHER): Payer: Self-pay | Admitting: Obstetrics & Gynecology

## 2021-05-28 ENCOUNTER — Ambulatory Visit (INDEPENDENT_AMBULATORY_CARE_PROVIDER_SITE_OTHER): Payer: BC Managed Care – PPO | Admitting: Obstetrics & Gynecology

## 2021-05-28 ENCOUNTER — Other Ambulatory Visit: Payer: Self-pay

## 2021-05-28 VITALS — BP 136/80 | HR 68 | Ht 63.5 in | Wt 213.4 lb

## 2021-05-28 DIAGNOSIS — N80129 Deep endometriosis of ovary, unspecified ovary: Secondary | ICD-10-CM

## 2021-05-28 DIAGNOSIS — E2839 Other primary ovarian failure: Secondary | ICD-10-CM

## 2021-05-28 DIAGNOSIS — Z124 Encounter for screening for malignant neoplasm of cervix: Secondary | ICD-10-CM

## 2021-05-28 DIAGNOSIS — Z01419 Encounter for gynecological examination (general) (routine) without abnormal findings: Secondary | ICD-10-CM

## 2021-05-28 DIAGNOSIS — N390 Urinary tract infection, site not specified: Secondary | ICD-10-CM

## 2021-05-28 NOTE — Progress Notes (Signed)
60 y.o. G0P0000 Married White or Caucasian female here for annual exam.  Doing well.  Denies vaginal bleeding.  Denies pelvic pain.  H/o recurrent UTIs.  No recent ones.  Uses bactrim for prophylaxis.  Patient's last menstrual period was 10/06/2013 (exact date).          Sexually active: Yes.    The current method of family planning is post menopausal status.    Exercising: No.  Smoker:  no  Health Maintenance: Pap:  10/15/17 neg History of abnormal Pap:  yes MMG:  01/24/21 Colonoscopy:  2017, dr. Collene Mares BMD:   discussed Screening labs: had lab work with Dr. Chalmers Cater this year   reports that she has never smoked. She has never used smokeless tobacco. She reports that she does not drink alcohol and does not use drugs.  Past Medical History:  Diagnosis Date   Abnormal Pap smear of cervix    Blood type O-    Cervical polyp    Endometrioma 11/2017   bilateral   History of recurrent UTI (urinary tract infection)    Hypertension 2011   Infertility, female    Menstrual migraine    Thyroid disease 2008    Past Surgical History:  Procedure Laterality Date   CHOLECYSTECTOMY, LAPAROSCOPIC  10/07   COLPOSCOPY     WISDOM TOOTH EXTRACTION  age 40    Current Outpatient Medications  Medication Sig Dispense Refill   Calcium Carbonate-Vitamin D (CALCIUM-VITAMIN D) 500-200 MG-UNIT per tablet Take 2 tablets by mouth daily.      Cholecalciferol (VITAMIN D PO) Take 1,000 Int'l Units by mouth daily.      CRANBERRY PO Take by mouth daily.      Digestive Aids Mixture (PROTEOLYTIC ENZYMES PO) Take 3 tablets by mouth daily.     levothyroxine (SYNTHROID, LEVOTHROID) 88 MCG tablet Take 88 mcg by mouth daily before breakfast.     losartan (COZAAR) 50 MG tablet Take 1 tablet by mouth daily.     Multiple Vitamin (MULTIVITAMIN) tablet Take 1 tablet by mouth daily.     Multiple Vitamins-Minerals (ZINC PO) Take by mouth.     Probiotic Product (PROBIOTIC PO) Take by mouth daily.       sulfamethoxazole-trimethoprim (BACTRIM DS) 800-160 MG tablet TAKE POST COITAL AS NEEDED 30 tablet 1   No current facility-administered medications for this visit.    Family History  Problem Relation Age of Onset   Stroke Mother        mini stroke   Heart disease Mother    Alzheimer's disease Mother    Diabetes Father    Heart disease Father        CHF   Kidney failure Father    Heart disease Maternal Grandfather    COPD Maternal Grandmother     Review of Systems  All other systems reviewed and are negative.  Exam:   BP 136/80    Pulse 68    Ht 5' 3.5" (1.613 m)    Wt 213 lb 6.4 oz (96.8 kg)    LMP 10/06/2013 (Exact Date) Comment: spotting 2/19   BMI 37.21 kg/m   Height: 5' 3.5" (161.3 cm)  General appearance: alert, cooperative and appears stated age Head: Normocephalic, without obvious abnormality, atraumatic Neck: no adenopathy, supple, symmetrical, trachea midline and thyroid normal to inspection and palpation Lungs: clear to auscultation bilaterally Breasts: normal appearance, no masses or tenderness Heart: regular rate and rhythm Abdomen: soft, non-tender; bowel sounds normal; no masses,  no organomegaly Extremities: extremities  normal, atraumatic, no cyanosis or edema Skin: Skin color, texture, turgor normal. No rashes or lesions Lymph nodes: Cervical, supraclavicular, and axillary nodes normal. No abnormal inguinal nodes palpated Neurologic: Grossly normal   Pelvic: External genitalia:  no lesions              Urethra:  normal appearing urethra with no masses, tenderness or lesions              Bartholins and Skenes: normal                 Vagina: normal appearing vagina with normal color and no discharge, no lesions              Cervix: no lesions              Pap taken: Yes.   Bimanual Exam:  Uterus:  normal size, contour, position, consistency, mobility, non-tender              Adnexa: normal adnexa and no mass, fullness, tenderness                Rectovaginal: Confirms               Anus:  normal sphincter tone, no lesions  Chaperone, Octaviano Batty, CMA, was present for exam.  Assessment/Plan: 1. Well woman exam with routine gynecological exam - pap and HR HPV obtained today - colonoscopy release signed today - BMD ordered - lab work done with Dr. Chalmers Cater this year - MMG up to date - vaccines reviewed/updated.  We discussed shingrix vaccination.  She will plan to get it this upcoming years  2. Cervical cancer screening - Cytology - PAP( [facility])  3. Recurrent UTI - uses bactrim for prophylaxis.  Does not need new rx.  4. Endometrioma - has seen gyn oncology.  Has two full years of follow up ultrasounds that were stable and normal ca-125's.  Will repeat imaging with new physical exam findings or new symptoms  5. Hypoestrogenism - DG BONE DENSITY (DXA); Future

## 2021-06-02 LAB — CYTOLOGY - PAP
Comment: NEGATIVE
Diagnosis: NEGATIVE
Diagnosis: REACTIVE
High risk HPV: NEGATIVE

## 2021-06-18 DIAGNOSIS — E039 Hypothyroidism, unspecified: Secondary | ICD-10-CM | POA: Diagnosis not present

## 2021-10-14 DIAGNOSIS — J019 Acute sinusitis, unspecified: Secondary | ICD-10-CM | POA: Diagnosis not present

## 2021-11-19 ENCOUNTER — Other Ambulatory Visit: Payer: BC Managed Care – PPO

## 2021-12-18 ENCOUNTER — Other Ambulatory Visit: Payer: Self-pay | Admitting: Obstetrics & Gynecology

## 2021-12-18 ENCOUNTER — Other Ambulatory Visit (HOSPITAL_BASED_OUTPATIENT_CLINIC_OR_DEPARTMENT_OTHER): Payer: Self-pay | Admitting: Obstetrics & Gynecology

## 2021-12-18 DIAGNOSIS — E2839 Other primary ovarian failure: Secondary | ICD-10-CM

## 2021-12-18 DIAGNOSIS — Z1231 Encounter for screening mammogram for malignant neoplasm of breast: Secondary | ICD-10-CM

## 2022-02-17 ENCOUNTER — Ambulatory Visit
Admission: RE | Admit: 2022-02-17 | Discharge: 2022-02-17 | Disposition: A | Discharge status: Home / Self Care | Payer: BC Managed Care – PPO | Source: Ambulatory Visit | Attending: Obstetrics & Gynecology | Admitting: Obstetrics & Gynecology

## 2022-02-17 DIAGNOSIS — Z1231 Encounter for screening mammogram for malignant neoplasm of breast: Secondary | ICD-10-CM

## 2022-03-09 DIAGNOSIS — E119 Type 2 diabetes mellitus without complications: Secondary | ICD-10-CM | POA: Diagnosis not present

## 2022-04-23 ENCOUNTER — Encounter (HOSPITAL_BASED_OUTPATIENT_CLINIC_OR_DEPARTMENT_OTHER): Payer: Self-pay | Admitting: Obstetrics & Gynecology

## 2022-04-23 ENCOUNTER — Ambulatory Visit (HOSPITAL_BASED_OUTPATIENT_CLINIC_OR_DEPARTMENT_OTHER): Payer: BC Managed Care – PPO | Admitting: Obstetrics & Gynecology

## 2022-04-23 VITALS — BP 165/80 | HR 71 | Ht 64.75 in | Wt 220.0 lb

## 2022-04-23 DIAGNOSIS — N80129 Deep endometriosis of ovary, unspecified ovary: Secondary | ICD-10-CM | POA: Diagnosis not present

## 2022-04-23 DIAGNOSIS — N764 Abscess of vulva: Secondary | ICD-10-CM | POA: Diagnosis not present

## 2022-04-23 MED ORDER — SULFAMETHOXAZOLE-TRIMETHOPRIM 800-160 MG PO TABS: 1.0000 | ORAL_TABLET | Freq: Two times a day (BID) | ORAL | 0 refills | Status: DC

## 2022-04-24 DIAGNOSIS — N80129 Deep endometriosis of ovary, unspecified ovary: Secondary | ICD-10-CM | POA: Insufficient documentation

## 2022-04-24 NOTE — Progress Notes (Signed)
GYNECOLOGY  VISIT  CC:   tenderness on left labia  HPI: 61 y.o. G0P0000 Married White or Caucasian female here for about three day complaint of increasing tenderness and size of left labia.  Was on vacation last week.  Went to [place].  There was a lot of sitting and driving.  Area on labia is now very tender and there is a lot of pressure with sitting.  No fevers.   Past Medical History:  Diagnosis Date   Abnormal Pap smear of cervix    Blood type O-    Cervical polyp    Endometrioma 11/2017   bilateral   History of recurrent UTI (urinary tract infection)    Hypertension 2011   Infertility, female    Menstrual migraine    Thyroid disease 2008    MEDS:   Current Outpatient Medications on File Prior to Visit  Medication Sig Dispense Refill   Calcium Carbonate-Vitamin D (CALCIUM-VITAMIN D) 500-200 MG-UNIT per tablet Take 2 tablets by mouth daily.      Cholecalciferol (VITAMIN D PO) Take 1,000 Int'l Units by mouth daily.      CRANBERRY PO Take by mouth daily.      Digestive Aids Mixture (PROTEOLYTIC ENZYMES PO) Take 3 tablets by mouth daily.     levothyroxine (SYNTHROID, LEVOTHROID) 88 MCG tablet Take 88 mcg by mouth daily before breakfast.     losartan (COZAAR) 50 MG tablet Take 1 tablet by mouth daily.     Multiple Vitamin (MULTIVITAMIN) tablet Take 1 tablet by mouth daily.     Multiple Vitamins-Minerals (ZINC PO) Take by mouth.     Probiotic Product (PROBIOTIC PO) Take by mouth daily.      sulfamethoxazole-trimethoprim (BACTRIM DS) 800-160 MG tablet TAKE POST COITAL AS NEEDED 30 tablet 1   No current facility-administered medications on file prior to visit.    ALLERGIES: Macrodantin [nitrofurantoin macrocrystal]  SH:  married, non smoker  Review of Systems  Constitutional: Negative.     PHYSICAL EXAMINATION:    BP (!) 165/80 (BP Location: Right Arm, Patient Position: Sitting, Cuff Size: Large)   Pulse 71   Ht 5' 4.75" (1.645 m) Comment: Reported  Wt 220 lb (99.8 kg)    LMP 10/06/2013 (Exact Date) Comment: spotting 2/19  BMI 36.89 kg/m     General appearance: alert, cooperative and appears stated age Lymph:  no inguinal LAD noted  Pelvic: External genitalia: enlarged left labia with about 2.5cm abscess appearing area, surrounding erythema present, quite tender to touch              Urethra:  normal appearing urethra with no masses, tenderness or lesions              Bartholins and Skenes: normal                  Recommended I&D of labial abscess.  Consent obtained.  Area cleansed with betadine x 3.  1.5cc 1% lidocaine instilled.  Lesion opened with #11 blade.  Purulent drainage and sebaceous material removed from lesion.  No cyst wall was identified for removal.  Lesion probed with sterile q tip and was not very deep.  Packing not possible.  Minimal bleeding noted.  Pt tolerated procedure well.  No dressing applied.  Chaperone, Octaviano Batty, CMA, was present for exam.  Assessment/Plan: 1. Labial abscess - I&D performed today - follow up discussed for non-improving lesion.  Induration and timing for resolution discussed - culture obtained.  Antibiotics started. - no  dressing.  Pt will use mini pads.  Hot compressed TID and using shower to irrigate/wash discussed. - sulfamethoxazole-trimethoprim (BACTRIM DS) 800-160 MG tablet; Take 1 tablet by mouth 2 (two) times daily.  Dispense: 14 tablet; Refill: 0 - WOUND CULTURE

## 2022-04-26 LAB — WOUND CULTURE

## 2022-04-28 DIAGNOSIS — M1712 Unilateral primary osteoarthritis, left knee: Secondary | ICD-10-CM | POA: Diagnosis not present

## 2022-05-12 DIAGNOSIS — R7301 Impaired fasting glucose: Secondary | ICD-10-CM | POA: Diagnosis not present

## 2022-05-12 DIAGNOSIS — E039 Hypothyroidism, unspecified: Secondary | ICD-10-CM | POA: Diagnosis not present

## 2022-05-12 DIAGNOSIS — E7800 Pure hypercholesterolemia, unspecified: Secondary | ICD-10-CM | POA: Diagnosis not present

## 2022-05-19 DIAGNOSIS — E039 Hypothyroidism, unspecified: Secondary | ICD-10-CM | POA: Diagnosis not present

## 2022-05-19 DIAGNOSIS — I10 Essential (primary) hypertension: Secondary | ICD-10-CM | POA: Diagnosis not present

## 2022-05-19 DIAGNOSIS — E049 Nontoxic goiter, unspecified: Secondary | ICD-10-CM | POA: Diagnosis not present

## 2022-05-19 DIAGNOSIS — R7301 Impaired fasting glucose: Secondary | ICD-10-CM | POA: Diagnosis not present

## 2022-05-31 DIAGNOSIS — R519 Headache, unspecified: Secondary | ICD-10-CM | POA: Diagnosis not present

## 2022-05-31 DIAGNOSIS — R051 Acute cough: Secondary | ICD-10-CM | POA: Diagnosis not present

## 2022-05-31 DIAGNOSIS — R509 Fever, unspecified: Secondary | ICD-10-CM | POA: Diagnosis not present

## 2022-05-31 DIAGNOSIS — J101 Influenza due to other identified influenza virus with other respiratory manifestations: Secondary | ICD-10-CM | POA: Diagnosis not present

## 2022-06-01 ENCOUNTER — Inpatient Hospital Stay: Admission: RE | Admit: 2022-06-01 | Payer: BC Managed Care – PPO | Source: Ambulatory Visit

## 2022-06-04 ENCOUNTER — Ambulatory Visit (HOSPITAL_BASED_OUTPATIENT_CLINIC_OR_DEPARTMENT_OTHER): Payer: BC Managed Care – PPO | Admitting: Obstetrics & Gynecology

## 2022-07-20 DIAGNOSIS — E7800 Pure hypercholesterolemia, unspecified: Secondary | ICD-10-CM | POA: Diagnosis not present

## 2022-07-20 DIAGNOSIS — E039 Hypothyroidism, unspecified: Secondary | ICD-10-CM | POA: Diagnosis not present

## 2022-08-11 ENCOUNTER — Ambulatory Visit (INDEPENDENT_AMBULATORY_CARE_PROVIDER_SITE_OTHER): Payer: BC Managed Care – PPO | Admitting: Obstetrics & Gynecology

## 2022-08-11 ENCOUNTER — Encounter (HOSPITAL_BASED_OUTPATIENT_CLINIC_OR_DEPARTMENT_OTHER): Payer: Self-pay | Admitting: Obstetrics & Gynecology

## 2022-08-11 VITALS — BP 136/78 | HR 70 | Ht 64.0 in | Wt 214.8 lb

## 2022-08-11 DIAGNOSIS — E039 Hypothyroidism, unspecified: Secondary | ICD-10-CM

## 2022-08-11 DIAGNOSIS — I10 Essential (primary) hypertension: Secondary | ICD-10-CM

## 2022-08-11 DIAGNOSIS — N80129 Deep endometriosis of ovary, unspecified ovary: Secondary | ICD-10-CM

## 2022-08-11 DIAGNOSIS — Z01419 Encounter for gynecological examination (general) (routine) without abnormal findings: Secondary | ICD-10-CM

## 2022-08-11 DIAGNOSIS — Z87440 Personal history of urinary (tract) infections: Secondary | ICD-10-CM

## 2022-08-11 NOTE — Progress Notes (Signed)
62 y.o. G0P0000 Married White or Caucasian female here for annual exam.  Doing well.  Followed by Dr. Chalmers Cater in December.  Started crestor and just had follow lipids drawn.  Was down over 100 points.  Total 112.    Had the flu in December.  Wasn't too bad.    H/o recurrent UTis.  Uses bactrim for prophylaxis.  Doesn't need RF at this time.    Patient's last menstrual period was 10/06/2013 (exact date).          Sexually active: not much due to husband The current method of family planning is post menopausal status.     The pregnancy intention screening data noted above was reviewed. Potential methods of contraception were discussed. The patient elected to proceed with No data recorded.  Smoker:  no  Health Maintenance: Pap:  05/28/2021 Negative History of abnormal Pap:  no MMG:  02/17/2022 Negative Colonoscopy:  2017, Dr. Collene Mares BMD:  scheduled for June Screening Labs: done with Dr. Chalmers Cater   reports that she has never smoked. She has never used smokeless tobacco. She reports that she does not drink alcohol and does not use drugs.  Past Medical History:  Diagnosis Date   Abnormal Pap smear of cervix    Blood type O-    Cervical polyp    Endometrioma 11/2017   bilateral   History of recurrent UTI (urinary tract infection)    Hypertension 2011   Infertility, female    Menstrual migraine    Thyroid disease 2008    Past Surgical History:  Procedure Laterality Date   CHOLECYSTECTOMY, LAPAROSCOPIC  10/07   COLPOSCOPY     WISDOM TOOTH EXTRACTION  age 74    Current Outpatient Medications  Medication Sig Dispense Refill   Calcium Carbonate-Vitamin D (CALCIUM-VITAMIN D) 500-200 MG-UNIT per tablet Take 2 tablets by mouth daily.      Cholecalciferol (VITAMIN D PO) Take 1,000 Int'l Units by mouth daily.      CRANBERRY PO Take by mouth daily.      Digestive Aids Mixture (PROTEOLYTIC ENZYMES PO) Take 3 tablets by mouth daily.     losartan (COZAAR) 50 MG tablet Take 1 tablet by mouth  daily.     Multiple Vitamin (MULTIVITAMIN) tablet Take 1 tablet by mouth daily.     Multiple Vitamins-Minerals (ZINC PO) Take by mouth.     Probiotic Product (PROBIOTIC PO) Take by mouth daily.      rosuvastatin (CRESTOR) 10 MG tablet Take 10 mg by mouth daily.     No current facility-administered medications for this visit.    Family History  Problem Relation Age of Onset   Stroke Mother        mini stroke   Heart disease Mother    Alzheimer's disease Mother    Diabetes Father    Heart disease Father        CHF   Kidney failure Father    Heart disease Maternal Grandfather    COPD Maternal Grandmother     ROS: Constitutional: negative Genitourinary:negative  Exam:   BP 136/78   Pulse 70   Ht '5\' 4"'$  (1.626 m) Comment: Reported  Wt 214 lb 12.8 oz (97.4 kg)   LMP 10/06/2013 (Exact Date) Comment: spotting 2/19  BMI 36.87 kg/m   Height: '5\' 4"'$  (162.6 cm) (Reported)  General appearance: alert, cooperative and appears stated age Head: Normocephalic, without obvious abnormality, atraumatic Neck: no adenopathy, supple, symmetrical, trachea midline and thyroid normal to inspection and palpation Lungs:  clear to auscultation bilaterally Breasts: normal appearance, no masses or tenderness Heart: regular rate and rhythm Abdomen: soft, non-tender; bowel sounds normal; no masses,  no organomegaly Extremities: extremities normal, atraumatic, no cyanosis or edema Skin: Skin color, texture, turgor normal. No rashes or lesions Lymph nodes: Cervical, supraclavicular, and axillary nodes normal. No abnormal inguinal nodes palpated Neurologic: Grossly normal   Pelvic: External genitalia:  no lesions              Urethra:  normal appearing urethra with no masses, tenderness or lesions              Bartholins and Skenes: normal                 Vagina: normal appearing vagina with normal color and no discharge, no lesions              Cervix: no lesions              Pap taken: No. Bimanual  Exam:  Uterus:  normal size, contour, position, consistency, mobility, non-tender              Adnexa: normal adnexa and no mass, fullness, tenderness               Rectovaginal: Confirms               Anus:  normal sphincter tone, no lesions  Chaperone, Octaviano Batty, CMA, was present for exam.  Assessment/Plan: 1. Well woman exam with routine gynecological exam - Pap smear 05/2021 - Mammogram 02/2022 - Colonoscopy 2017 - Bone mineral density scheduled - lab work done with Dr. Chalmers Cater - vaccines reviewed/updated.  Shingrix discussed.  2. Primary hypertension - on losartan  3. Endometrioma of ovary - did see gyn oncology and was followed by ultrasound for two full years without changes.  Ca 125 levels were normal.  Lab work follow up not needed.  Will repeat with any change in physical exam.  4. Acquired hypothyroidism - followed by Dr. Chalmers Cater  5. History of recurrent UTIs - does not need RF for bactrim

## 2022-11-18 ENCOUNTER — Ambulatory Visit
Admission: RE | Admit: 2022-11-18 | Discharge: 2022-11-18 | Disposition: A | Discharge status: Home / Self Care | Payer: BC Managed Care – PPO | Source: Ambulatory Visit | Attending: Obstetrics & Gynecology | Admitting: Obstetrics & Gynecology

## 2022-11-18 DIAGNOSIS — E2839 Other primary ovarian failure: Secondary | ICD-10-CM

## 2022-11-18 DIAGNOSIS — N951 Menopausal and female climacteric states: Secondary | ICD-10-CM | POA: Diagnosis not present

## 2022-11-18 DIAGNOSIS — E349 Endocrine disorder, unspecified: Secondary | ICD-10-CM | POA: Diagnosis not present

## 2022-11-18 DIAGNOSIS — E039 Hypothyroidism, unspecified: Secondary | ICD-10-CM | POA: Diagnosis not present

## 2023-01-26 ENCOUNTER — Other Ambulatory Visit: Payer: Self-pay | Admitting: Obstetrics & Gynecology

## 2023-01-26 DIAGNOSIS — Z1231 Encounter for screening mammogram for malignant neoplasm of breast: Secondary | ICD-10-CM

## 2023-02-22 ENCOUNTER — Ambulatory Visit
Admission: RE | Admit: 2023-02-22 | Discharge: 2023-02-22 | Disposition: A | Discharge status: Home / Self Care | Payer: BC Managed Care – PPO | Source: Ambulatory Visit

## 2023-02-22 DIAGNOSIS — Z1231 Encounter for screening mammogram for malignant neoplasm of breast: Secondary | ICD-10-CM | POA: Diagnosis not present

## 2023-04-05 DIAGNOSIS — E119 Type 2 diabetes mellitus without complications: Secondary | ICD-10-CM | POA: Diagnosis not present

## 2023-05-20 ENCOUNTER — Other Ambulatory Visit: Payer: Self-pay | Admitting: Endocrinology

## 2023-05-20 DIAGNOSIS — E039 Hypothyroidism, unspecified: Secondary | ICD-10-CM | POA: Diagnosis not present

## 2023-05-20 DIAGNOSIS — R7301 Impaired fasting glucose: Secondary | ICD-10-CM

## 2023-05-20 DIAGNOSIS — E7800 Pure hypercholesterolemia, unspecified: Secondary | ICD-10-CM | POA: Diagnosis not present

## 2023-05-27 ENCOUNTER — Ambulatory Visit
Admission: RE | Admit: 2023-05-27 | Discharge: 2023-05-27 | Disposition: A | Discharge status: Home / Self Care | Payer: BC Managed Care – PPO | Source: Ambulatory Visit | Attending: Endocrinology | Admitting: Endocrinology

## 2023-05-27 DIAGNOSIS — E039 Hypothyroidism, unspecified: Secondary | ICD-10-CM | POA: Diagnosis not present

## 2023-05-27 DIAGNOSIS — R7301 Impaired fasting glucose: Secondary | ICD-10-CM | POA: Diagnosis not present

## 2023-05-27 DIAGNOSIS — I10 Essential (primary) hypertension: Secondary | ICD-10-CM | POA: Diagnosis not present

## 2023-05-27 DIAGNOSIS — E7800 Pure hypercholesterolemia, unspecified: Secondary | ICD-10-CM | POA: Diagnosis not present

## 2023-05-27 DIAGNOSIS — E049 Nontoxic goiter, unspecified: Secondary | ICD-10-CM | POA: Diagnosis not present

## 2023-07-02 DIAGNOSIS — R509 Fever, unspecified: Secondary | ICD-10-CM | POA: Diagnosis not present

## 2023-07-02 DIAGNOSIS — R0981 Nasal congestion: Secondary | ICD-10-CM | POA: Diagnosis not present

## 2023-07-02 DIAGNOSIS — J209 Acute bronchitis, unspecified: Secondary | ICD-10-CM | POA: Diagnosis not present

## 2023-07-02 DIAGNOSIS — R051 Acute cough: Secondary | ICD-10-CM | POA: Diagnosis not present

## 2023-08-26 ENCOUNTER — Encounter (HOSPITAL_BASED_OUTPATIENT_CLINIC_OR_DEPARTMENT_OTHER): Payer: Self-pay | Admitting: Obstetrics & Gynecology

## 2023-08-26 ENCOUNTER — Ambulatory Visit (INDEPENDENT_AMBULATORY_CARE_PROVIDER_SITE_OTHER): Payer: BC Managed Care – PPO | Admitting: Obstetrics & Gynecology

## 2023-08-26 VITALS — BP 154/61 | HR 67 | Ht 63.25 in | Wt 212.6 lb

## 2023-08-26 DIAGNOSIS — I10 Essential (primary) hypertension: Secondary | ICD-10-CM | POA: Diagnosis not present

## 2023-08-26 DIAGNOSIS — Z01419 Encounter for gynecological examination (general) (routine) without abnormal findings: Secondary | ICD-10-CM

## 2023-08-26 DIAGNOSIS — N80129 Deep endometriosis of ovary, unspecified ovary: Secondary | ICD-10-CM

## 2023-08-26 DIAGNOSIS — Z87440 Personal history of urinary (tract) infections: Secondary | ICD-10-CM

## 2023-08-26 MED ORDER — SULFAMETHOXAZOLE-TRIMETHOPRIM 800-160 MG PO TABS: ORAL_TABLET | ORAL | 1 refills | Status: AC

## 2023-08-26 NOTE — Progress Notes (Signed)
 ANNUAL EXAM Patient name: Melanie Vaughn MRN [id number]  Date of birth: 1960-09-04 Chief Complaint:   Annual Exam  History of Present Illness:   Melanie Vaughn is a 63 y.o. G0P0000 Caucasian female being seen today for a routine annual exam.  Denies vaginal bleeding.    H/o UTIs.  Does need bactrim RF.    Patient's last menstrual period was 10/06/2013 (exact date).   Last pap 05/28/2021. Results were: NILM w/ HRHPV negative. H/O abnormal pap: yes Last mammogram: 02/22/2023. Results were: normal. Family h/o breast cancer: no Last colonoscopy: 2017, Dr. Loreta Ave.  Follow up 10 years.  DEXA:  11/2022 normal     08/26/2023    3:17 PM 08/11/2022    9:08 AM 04/23/2022    9:49 AM 05/28/2021    8:17 AM  Depression screen PHQ 2/9  Decreased Interest 0 0 0 0  Down, Depressed, Hopeless 0 0 0 0  PHQ - 2 Score 0 0 0 0    Review of Systems:   Pertinent items are noted in HPI  Denies any pelvic pain, urinary symptoms, vaginal discharge chnages, bowel changes  Pertinent History Reviewed:  Reviewed past medical,surgical, social and family history.  Reviewed problem list, medications and allergies. Physical Assessment:   Vitals:   08/26/23 1515  BP: (!) 154/61  Pulse: 67  Weight: 212 lb 9.6 oz (96.4 kg)  Height: 5' 3.25" (1.607 m)  Body mass index is 37.36 kg/m.        Physical Examination:   General appearance - well appearing, and in no distress  Mental status - alert, oriented to person, place, and time  Psych:  She has a normal mood and affect  Skin - warm and dry, normal color, no suspicious lesions noted  Chest - effort normal, all lung fields clear to auscultation bilaterally  Heart - normal rate and regular rhythm  Neck:  midline trachea, no thyromegaly or nodules  Breasts - breasts appear normal, no suspicious masses, no skin or nipple changes or  axillary nodes  Abdomen - soft, nontender, nondistended, no masses or organomegaly  Pelvic - VULVA: normal appearing  vulva with no masses, tenderness or lesions   VAGINA: normal appearing vagina with normal color and discharge, no lesions   CERVIX: normal appearing cervix without discharge or lesions, no CMT  Thin prep pap is not done today  UTERUS: uterus is felt to be normal size, shape, consistency and nontender   ADNEXA: No adnexal masses or tenderness noted.  Rectal - normal rectal, good sphincter tone, no masses felt.   Extremities:  No swelling or varicosities noted  Chaperone present for exam  Assessment & Plan:  1. Well woman exam with routine gynecological exam (Primary) - Pap smear 05/2021 - Mammogram 02/24/2023 - Colonoscopy 2017, follow up 10 years - Bone mineral density 11/18/2022 - lab work done with PCP, Dr.  Marland Kitchen vaccines reviewed/updated  2. History of recurrent UTIs - sulfamethoxazole-trimethoprim (BACTRIM DS) 800-160 MG tablet; TAKE POST COITAL AS NEEDED  Dispense: 30 tablet; Refill: 1  3. Primary hypertension - on losartan  4. Endometrioma of ovary - followed by oncology and with ultrasounds for two full years.  Cannot feel on exam today.   Meds:  Meds ordered this encounter  Medications   sulfamethoxazole-trimethoprim (BACTRIM DS) 800-160 MG tablet    Sig: TAKE POST COITAL AS NEEDED    Dispense:  30 tablet    Refill:  1    Follow-up: Return in about 1  year (around 08/25/2024).  Jerene Bears, MD 08/26/2023 4:02 PM

## 2024-01-21 ENCOUNTER — Other Ambulatory Visit: Payer: Self-pay | Admitting: Obstetrics & Gynecology

## 2024-01-21 DIAGNOSIS — Z1231 Encounter for screening mammogram for malignant neoplasm of breast: Secondary | ICD-10-CM

## 2024-02-23 ENCOUNTER — Encounter

## 2024-02-23 DIAGNOSIS — Z1231 Encounter for screening mammogram for malignant neoplasm of breast: Secondary | ICD-10-CM

## 2024-02-29 ENCOUNTER — Ambulatory Visit
Admission: RE | Admit: 2024-02-29 | Discharge: 2024-02-29 | Disposition: A | Discharge status: Home / Self Care | Source: Ambulatory Visit | Attending: Obstetrics & Gynecology | Admitting: Obstetrics & Gynecology

## 2024-02-29 DIAGNOSIS — Z1231 Encounter for screening mammogram for malignant neoplasm of breast: Secondary | ICD-10-CM | POA: Diagnosis not present

## 2024-04-10 DIAGNOSIS — E119 Type 2 diabetes mellitus without complications: Secondary | ICD-10-CM | POA: Diagnosis not present

## 2024-08-28 ENCOUNTER — Ambulatory Visit (HOSPITAL_BASED_OUTPATIENT_CLINIC_OR_DEPARTMENT_OTHER): Admitting: Certified Nurse Midwife

## 2024-09-14 ENCOUNTER — Ambulatory Visit (HOSPITAL_BASED_OUTPATIENT_CLINIC_OR_DEPARTMENT_OTHER): Payer: Self-pay | Admitting: Obstetrics and Gynecology
